# Patient Record
Sex: Male | Born: 1940
Health system: Southern US, Community
[De-identification: ages and names within clinical notes are randomized; demographics above are authoritative.]

## PROBLEM LIST (undated history)

## (undated) ENCOUNTER — Emergency Department: Discharge status: Absent without leave | Payer: Self-pay

## (undated) DIAGNOSIS — I82409 Acute embolism and thrombosis of unspecified deep veins of unspecified lower extremity: Secondary | ICD-10-CM

## (undated) DIAGNOSIS — F0390 Unspecified dementia without behavioral disturbance: Secondary | ICD-10-CM

## (undated) DIAGNOSIS — C61 Malignant neoplasm of prostate: Secondary | ICD-10-CM

## (undated) DIAGNOSIS — I2510 Atherosclerotic heart disease of native coronary artery without angina pectoris: Secondary | ICD-10-CM

## (undated) DIAGNOSIS — E039 Hypothyroidism, unspecified: Secondary | ICD-10-CM

## (undated) DIAGNOSIS — M47816 Spondylosis without myelopathy or radiculopathy, lumbar region: Secondary | ICD-10-CM

## (undated) DIAGNOSIS — E785 Hyperlipidemia, unspecified: Secondary | ICD-10-CM

## (undated) DIAGNOSIS — I10 Essential (primary) hypertension: Secondary | ICD-10-CM

## (undated) HISTORY — PX: BACK SURGERY: SHX140

---

## 2004-08-16 ENCOUNTER — Ambulatory Visit: Payer: Self-pay | Admitting: Cardiology

## 2004-08-17 ENCOUNTER — Ambulatory Visit (HOSPITAL_COMMUNITY): Admission: RE | Admit: 2004-08-17 | Discharge: 2004-08-19 | Payer: Self-pay | Admitting: *Deleted

## 2004-08-17 ENCOUNTER — Ambulatory Visit: Payer: Self-pay | Admitting: Cardiology

## 2004-08-23 ENCOUNTER — Ambulatory Visit (HOSPITAL_COMMUNITY): Admission: RE | Admit: 2004-08-23 | Discharge: 2004-08-24 | Payer: Self-pay | Admitting: Cardiology

## 2004-09-07 ENCOUNTER — Ambulatory Visit: Payer: Self-pay | Admitting: Cardiology

## 2006-06-28 ENCOUNTER — Ambulatory Visit: Payer: Self-pay | Admitting: Physician Assistant

## 2006-10-16 ENCOUNTER — Ambulatory Visit: Payer: Self-pay | Admitting: Cardiology

## 2006-11-01 ENCOUNTER — Inpatient Hospital Stay (HOSPITAL_COMMUNITY): Admission: RE | Admit: 2006-11-01 | Discharge: 2006-11-04 | Payer: Self-pay | Admitting: Neurosurgery

## 2009-07-21 ENCOUNTER — Encounter: Admission: RE | Admit: 2009-07-21 | Discharge: 2009-07-21 | Payer: Self-pay | Admitting: Neurosurgery

## 2010-07-04 NOTE — Assessment & Plan Note (Signed)
[facility]                          [facility] OFFICE NOTE   NAME:Tony Chapman                 MRN:          [id number]  DATE:10/16/2006                            DOB:          05-09-1940    REASON FOR REFERRAL:  Preoperative clearance.   HISTORY OF PRESENT ILLNESS:  [name] is a 70 year old male patient  followed by Dr. Andee Lineman with a history of coronary artery disease, status  post two vessel PCI in June, 2006, who presents to the office today for  preoperative clearance.  He apparently has some sort of cyst on his  spine that is resulting in right-sided radiculopathy.  He has been  evaluated by Dr. Channing Mutters, and he is to undergo surgery.  This is apparently  going to be done at [facility] in the coming weeks.  The patient  denies any recurrent episodes of chest pain or shortness of breath.  He  denies any exertional chest heaviness or tightness.  He denies any  syncope or near syncope.  He denies any orthopnea, PND, or pedal edema.  He denies any tachy palpitations.  He denies any exertional shortness of  breath.   CURRENT MEDICATIONS:  1. Lipitor 40 mg a day.  2. Levothyroxine 125 mcg a day.  3. Generic Lotrel, unknown dosage, daily.  4. Aspirin 325 mg daily.  5. Hydrocodone p.r.n.  6. Plavix 75 mg daily.  He stopped taking this about a week or two ago      in preparation for his surgery.  7. Nitroglycerin p.r.n. chest pain.   ALLERGIES:  No known drug allergies.   SOCIAL HISTORY:  Denies any tobacco abuse.   REVIEW OF SYSTEMS:  Please see HPI.  He denies any fever, chills, cough,  melena, hematochezia, or hematuria.  The rest of the review of systems  are negative.   PHYSICAL EXAMINATION:  He is a well-developed and well-nourished male in  no distress.  Blood pressure 151/87, pulse 63, weight 196 pounds.  HEENT:  Normal.  NECK:  Without JVD.  CARDIAC:  S1 and S2.  Regular rate and rhythm.  LUNGS:  Clear to  auscultation bilaterally with no wheezes, rales or  rhonchi.  ABDOMEN:  Soft and nontender with normoactive bowel sounds.  No  organomegaly.  EXTREMITIES:  Without edema.  Calves are nontender.  SKIN:  Warm and dry.  NEUROLOGIC:  He is alert and oriented x3.  Cranial nerves II-XII are  grossly intact.   IMPRESSION:  1. Coronary artery disease.      a.     Status post bare metal stenting to the obtuse marginal and       left circumflex in June, 2006.      b.     Status post bare metal stenting to the right coronary artery       in June, 2006.      c.     Initial percutaneous intervention complicated by right groin       hematoma and retroperitoneal bleed.      d.     Residual coronary artery disease at the time of  catheterization:  Severely calcified left main, calcified left       anterior descending artery throughout the proximal portion, 50%       distal left anterior descending artery stenosis, diagonal mid 75%       before the bifurcation, and 50% stenosis in the ostium.  2. Preserved left ventricular function.  3. Hyperlipidemia.  4. Hypertension.  5. Hypothyroidism.  6. Spinal cyst, needs surgery.   PLAN:  Patient presents to the office today for preoperative clearance.  He underwent revascularization in 2006.  He has had no recurrent  symptoms of ischemia.  According to [facility] and AHA guidelines, he requires  no further cardiac workup prior to his noncardiac surgery and should be  an acceptable risk.  It is safe for him to come off the Plavix for his  surgery, as he underwent bare metal stenting two years ago.  However, he  should remain on aspirin throughout the perioperative period, unless  this is contraindicated from a surgical standpoint.  His blood pressure  does need better control.  Since there is upcoming surgery planned, I  have elected to place him on Toprol XL 25 mg a day.  Our service will  certainly be available in the perioperative period as  necessary.   I have discussed the above assessment and plan today with Dr. Andee Lineman.      Tereso Newcomer, PA-C  Electronically Signed      Learta Codding, MD,FACC  Electronically Signed   SW/MedQ  DD: 10/16/2006  DT: 10/16/2006  Job #: [id number]   cc:   Payton Doughty, M.D.  Dhruv Sherril Croon

## 2010-07-04 NOTE — Op Note (Signed)
NAMECOTY, LARSH NO.:  [id number]   MEDICAL RECORD NO.:  [id number]          PATIENT TYPE:  INP   LOCATION:  2899                         FACILITY:  [facility]   PHYSICIAN:  Payton Doughty, M.D.      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   PROCEDURE:  Right L5 laminotomy, foraminotomy for resection of synovial  cyst.   SURGEON:  Payton Doughty, M.D.   ANESTHESIA:  General endotracheal.   PREPARATION:  Sterile Betadine prep and scrub with alcohol wipe.   COMPLICATIONS:  None.   NURSE ASSISTANT:  [place].   DOCTOR ASSISTANT:  Hilda Lias, M.D.   BODY OF TEXT:  This is a 70 year old gentleman with a right S1  radiculopathy related to ligamentum flavum and synovial cyst on the  right side, taken to the operating room and smoothly anesthetized and  intubated, placed prone on the operating table following shave, prep and  drape in the usual sterile fashion.  The skin was infiltrated with 1%  lidocaine and 1:400,000 epinephrine.  The skin was incised over L5 and  S1.  The laminae of L5 and S1 were dissected free in the subperiosteal  plane.  Intraoperative x-ray showed a marker under L4.  We operated at  the next level below that.  Using the drill a hemisemilaminectomy was  carried out in L5 and also over the top of S1 on the right side.  The  ligamentum flavum was identified and removed.  It was extremely heavy  and redundant and contained within it was a large cyst that extended  from the L5-S1 facet joint and was a synovial cyst.  The ligamentum  flavum and the cyst were peeled off the dura on the right side, allowing  decompression of the right-sided thecal sac.  Once it was removed, the  sac was explored and was found to be free.  The wound was irrigated and  hemostasis assured.  Depo-Medrol-soaked fat was placed in the laminotomy  defect.  Successive layers of 0 Vicryl, 2-0 Vicryl, 3-0 nylon were used  to close.  A Betadine Telfa dressing was applied, made occlusive with  OpSite, and the patient returned to the recovery room in good condition.    .           ______________________________  Payton Doughty, M.D.     MWR/MEDQ  D:  11/01/2006  T:  11/02/2006  Job:  [id number]

## 2010-07-04 NOTE — Assessment & Plan Note (Signed)
[facility]                          [facility]ICE NOTE   NAME:Tony Chapman                 MRN:          [id number]  DATE:06/28/2006                            DOB:          1940/09/10    CARDIOLOGIST:  Dr. Lewayne Chapman.   PRIMARY CARE PHYSICIAN:  Dr. Doreen Chapman.   HISTORY OF PRESENT ILLNESS:  Tony Chapman is a 70 year old male patient  followed by Dr. Andee Chapman with a history of coronary artery disease, status  post 2-vessel PCI in June of 2006.  He first had his obtuse marginal  stented with a bare metal stent.  The procedure was complicated by right  groin hematoma and retroperitoneal bleed.  He was eventually readmitted  back to [facility] in July of 2006 for PCI of his RCA lesion.  This was also stented with a bare metal stent.  At the time of his  initial evaluation, his residual CAD included severely calcified left  main, calcified LAD throughout the proximal portion, 50% distal LAD  stenosis, diagonal with a mid 75% stenosis before the bifurcation, and  50% stenosis in the ostium.  He has good LV function, and he has a 55%  by catheterization.  He returns today for routine followup.  He is doing  well.  Denies chest pain, shortness of breath.  Denies syncope.  Denies  palpitations.  Denies orthopnea or paroxysmal nocturnal dyspnea, lower  extremity edema.  He is active.  He lifts weights several times a week  without any chest discomfort or shortness of breath.   MEDICATIONS:  1. Lipitor 40 mg daily.  2. Levothyroxine 125 mcg a day.  3. Amlodipine/benazepril unknown dosage daily.  4. Terbinafine 250 mg a day.  5. Cyclobenzaprine 10 mg a day.  6. Diclofenac sodium 75 mg a day.  7. Aspirin 325 mg a day.  8. Nitroglycerin p.r.n. chest pain.   ALLERGIES:  NO KNOWN DRUG ALLERGIES.   SOCIAL HISTORY:  Denies any tobacco abuse.   PHYSICAL EXAMINATION:  He is a well-nourished, well-developed male in no  distress.  Blood  pressure 148/83, pulse 66, weight 207.6 pounds.  HEENT:  Normal.  NECK:  Without JVD.  Carotids without bruits bilaterally.  CARDIAC:  Normal S1, S2.  Regular rate and rhythm without murmurs.  LUNGS:  Clear to auscultation bilaterally without wheezing, rhonchi, or  rales.  ABDOMEN:  Soft, nontender, with normoactive bowel sounds, no  organomegaly, no bruits.  EXTREMITIES:  Without edema, calves soft, nontender.  SKIN:  Warm and dry.  Femoral arteries are 2+ bilaterally without bruits.   Electrocardiogram reveals sinus rhythm with a heart rate of 76, left  axis deviation, no acute changes.   IMPRESSION:  1. Coronary artery disease.      a.     Status post bare metal stenting to the obtuse marginal of       the left circumflex, as well as bare metal stenting of the right       coronary artery in summer of 2006.      b.     Initial percutaneous coronary intervention complicated by  right groin hematoma and retroperitoneal bleed.      c.     Residual coronary artery disease as outlined above.  2. Good left ventricular function.  3. Hyperlipidemia.  4. Hypertension.  5. Hypothyroidism.   PLAN:  The patient presents to the office today for routine followup.  He is doing well without any symptoms of chest pain or shortness of  breath.  He came off of Plavix about a month or 2 ago.  He would really  like to stay on this medication.  I had a long discussion with him today  about the reasons why he only needed to stay on Plavix 30 days post  intervention.  However, he is still quite interested in remaining on  this as he has done so well since we have seen him.  Therefore, I think  it is reasonable to keep him on the Plavix.  I have given him a  prescription for Plavix 75 mg a day.  He can remain on a baby aspirin a  day.  He is to followup with Dr. Sherril Chapman for his blood pressure and  cholesterol.  His LDL goal is less than 70 and blood pressure goal is  less than 140/90.  He can  follow up in the office in 12 months' time.      Tereso Newcomer, PA-C       Learta Codding, MD,FACC    SW/MedQ  DD: 06/28/2006  DT: 06/28/2006  Job #: [id number]   cc:   Tony Chapman

## 2010-07-04 NOTE — H&P (Signed)
NAMEALOIS, COLGAN NO.:  [id number]   MEDICAL RECORD NO.:  [id number]          PATIENT TYPE:  INP   LOCATION:  3311                         FACILITY:  [facility]   PHYSICIAN:  Payton Doughty, M.D.      DATE OF BIRTH:  07-Feb-1941   DATE OF ADMISSION:  11/01/2006  DATE OF DISCHARGE:                              HISTORY & PHYSICAL   ADMITTING DIAGNOSIS:  Right L5-S1 synovial cyst.   SERVICE:  Neurosurgery.   This is a very nice 70 year old right-handed black gentleman sometimes  having increasing pain in his back down his right leg.  Had numbness on  the right foot.  MR showed synovial cyst on the right side.  He is  admitted now for removal of the cyst.   MEDICAL HISTORY:  Remarkable coronary artery disease, vascular disease.  He had stents in his heart in 2006, has a penile implant in 2003 and  2008.   MEDICATIONS:  1. Amlodipine once a day.  2. Plavix once a day.  3. Lipitor once a day.  4. Oxycodone four a day.  5. Hydrocodone on a p.r.n. basis.  6. An aspirin a day.   He has no allergies.   SOCIAL HISTORY:  Does not smoke, does not drink.  Works for the [employer] up in [place].   FAMILY HISTORY:  Remarkable for hypertension.   REVIEW OF SYSTEMS:  Remarkable for hypertension, hypercholesterolemia,  leg pain, arthritis, thyroid disease.   HEENT:  Exam within normal limits.  He has good range of motion of his  neck:  CHEST:  Clear.  CARDIAC:  Regular rate and rhythm.  ABDOMEN:  Nontender with no hepatosplenomegaly.  EXTREMITIES:  Without clubbing or cyanosis.  GENITOURINARY EXAM:  Deferred.  PERIPHERAL PULSES:  Good.  NEUROLOGICAL:  He is awake, alert and oriented.  His cranial nerves are  intact.  Motor exam shows 5/5 strength throughout the upper extremities  and left lower extremity.  Right lower extremity has a little bit of  dorsiflexion weakness.  Reflexes are one at the knees, absent at the  right ankle, one at the left ankle.   Straight leg raise is negative.   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   The plan is for resection.  He has been seen by Dr. Andee Lineman and cleared.  The risks and benefits have been discussed with him and he wishes to  proceed.           ______________________________  Payton Doughty, M.D.     MWR/MEDQ  D:  11/01/2006  T:  11/02/2006  Job:  [id number]

## 2010-07-04 NOTE — Discharge Summary (Signed)
NAME:  Tony Chapman, Tony Chapman NO.:  [id number]   MEDICAL RECORD NO.:  [id number]           PATIENT TYPE:   LOCATION:                                 FACILITY:   PHYSICIAN:  Payton Doughty, M.D.           DATE OF BIRTH:   DATE OF ADMISSION:  11/01/2006  DATE OF DISCHARGE:  11/04/2006                               DISCHARGE SUMMARY   ADMITTING DIAGNOSIS:  Synovial cyst on the right side at L5-S1.   DISCHARGE DIAGNOSIS:  Synovial cyst on the right side at L5-S1.   OPERATIVE PROCEDURE:  Right L5-S1 laminectomy for synovial cyst.   SURGEON:  Trey Sailors, M.D.   SERVICE:  Neurosurgery.   COMPLICATIONS:  None.   DISPOSITION:  Discharge home.   HISTORY:  A 70 year old right-handed black gentleman whose history and  physical is recounted in the chart.  He has a right S1 radiculopathy  related to synovial cyst.  He had coronary artery disease and is cleared  by cardiology.  Exam was intact.  Safe for right S1 radiculopathy.  He  was admitted after ascertaining normal laboratory values and underwent a  5-1 laminectomy, removal of synovial cyst.  Postoperatively, his leg  pain is gone.  His strength is full.  He spent 3 nights in the ACU,  being monitored.  His cardiac status is completely stable.  He is on all  his home medications.  He is being discharged home in the care of his  family.   FOLLOWUP:  His followup will be in the [facility] offices in about 10 days  for suture removal.           ______________________________  Payton Doughty, M.D.     MWR/MEDQ  D:  11/04/2006  T:  11/04/2006  Job:  [id number]

## 2010-07-07 NOTE — Cardiovascular Report (Signed)
NAME:  Tony Chapman, Tony Chapman NO.:  [id number]   MEDICAL RECORD NO.:  [id number]          PATIENT TYPE:  OIB   LOCATION:                               FACILITY:  [facility]   PHYSICIAN:  Arturo Morton. Riley Kill, M.D. [facility] OF BIRTH:  Oct 02, 1940   DATE OF PROCEDURE:  08/23/2004  DATE OF DISCHARGE:                              CARDIAC CATHETERIZATION   INDICATIONS:  Patient is a very pleasant gentleman who previously underwent  stenting of the circumflex coronary artery.  This is a heavily calcified and  tortuous circumflex with a small caliber lumen at the site; and, therefore,  a non-drug-eluting stent was placed. He had a fairly heavily calcified right  coronary artery; and he was brought back, today, for a repeat  catheterization and possible intervention.  Postprocedure 6 days ago the  patient had a retroperitoneal bleed associated with the initial groin hold  postprocedure.  He has recovered nicely and doing well. Chest x-ray, today,  shows no acute abnormality and hemoglobin is up. We discussed the risks with  the patient and he was agreeable to proceed.   PROCEDURE:  1.  Selective coronary arteriography of the left coronary artery.  2.  Intravascular ultrasound of the right coronary artery.  3.  Percutaneous stenting of the right coronary artery.   DESCRIPTION OF PROCEDURE:  The patient was brought to the catheterization  lab and prepped and draped in usual fashion following informed consent.  Through an anterior puncture the left femoral artery was easily entered and  a 6-French sheath was placed. Selective coronary arteriography of the left  coronary artery was performed. Following this bivalve __________  was given  according to protocol. ACT was checked and was found to be 330.   Following this preparations were made for intravascular ultrasound.  A  [employer] RCA guide with side holes was then utilized to engage the  right coronary.  The lesion was crossed  with a Luge wire. Intravascular  ultrasound was performed which demonstrated a fair amount of calcium  particularly at the lesion site. The lesion did appear to be dilatable, but  it was felt that there might be some limitation of the ability of the vessel  to dilate much beyond 3 mm in an otherwise 3.5 mm artery. With this in mind,  we made the strategic decision to place a non-drug-eluting platform as a non-  drug-eluting platform had already been placed because of vessel size in the  circumflex. In addition, there was evidence by intravascular ultrasound of a  more proximal lesion about 20 mm proximal to the site of the high-grade  stenosis; and it that was felt that we would be best, to cover this entire  area with a 28-mm stent.   The lesion was predilated initially using a 2.5 by 10 cutting balloon. There  was fairly marked improvement in the artery.  We then upgraded to a 27.5 and  3.0 balloons was then subsequently a 27.5 Liberte stent 28-mm in length was  deployed into the artery. This was taken up nicely to approximately 16  atmospheres.  There was mild tapering in the area where the extensive  calcification was noted, but there was still a good lumen from this.  Following this we used a double wire to get down a post dilatation balloon;  and post dilatation was done with a 3.5-mm balloon. This was taken up to  about 12 atmospheres. Intravascular ultrasound was then performed.  The  major findings of the intravascular ultrasound was that the distal edge of  the stent was well deployed. It was deployed in a tapered area before the  very large area distal to this location. The overall deployment of the stent  was excellent except for a small area with 1-2 struts which were not fully  deployed, proximally in the midportion of the stent. The proximal stent was  well deployed angiographically, there is slight haziness proximal to the  stent site; and we carefully evaluated this using  ultrasound which suggested  a large amount of calcium.  There was a small irregular area just above the  stent by intravascular ultrasound, but it was also present prior to the  procedure. It was felt not to represent a luminal flap. After removal of the  intravascular ultrasound, we put the 3.5 balloon back down into the area  where there were 1-2 struts out; and we took the 3.5-mm balloon up to 14-16  atmospheres x2 to make sure the stent was fully deployed at this location.  This was subsequently removed. We then removed both wires.  Final  angiographic views were obtained.  He tolerated the procedure well without  complication. All catheters were removed and the femoral sheath sewn into  place; and he was taken to the holding area in satisfactory clinical  condition.   HEMODYNAMIC DATA:  1.  Central aortic pressure 120/63, mean 87.  2.  Angiographic data on plain fluoroscopy was extensive calcification of      both the LAD and the circumflex.  The LAD appears unchanged from the      original study.  It is extensively calcified. There is segmental      plaquing in the midportion of the vessel; and then also a lesion      distally of about 70%.  The midportion is just diffusely irregular with      34% segmental plaquing. There is a tapered diagonal branch which has      about 70% narrowing in the midportion and it also is calcified,      especially at the lesion.  3.  The circumflex is circuitous and provides a large marginal branch. This      marginal branch previously had a 95% stenosis, but the stent looks well      deployed and fully in place.  One additional note is the presence of a      70-80% apical stenosis in the apical portion of the LAD which is noted      on the previous study.  4.  The right coronary artery, also, is a heavily calcified vessel. It      tapers at the junction of the proximal-and-mid vessel to about 30 40%     and there is slight hypodensity.  Following  this, there is about 40-50%      narrowing then an 80% area of narrowing that extends over about 10 mm.      The PDA is diffusely irregular as was the posterolateral branch. The      distal vessel was heavily calcified and markedly  ectatic but without      significant focal narrowing. Following the balloon dilatation and      stenting the 80% area of narrowing and then proximal to that is reduced      to less than 20% with really a nice angiographic result. There is 40%      narrowing just proximal to the stent and on ultrasound, a lot of this is      demonstrated with calcification. Runoff is TIMI 3 at the completion of      procedure.   Intravascular ultrasound was done distal to the lesion in then coming  proximally. Prior to the intervention, the diameter of the lesion site was  about 17 x 16. Following dilatation this was 30 x 31 with a fully expanded  stent. Above the stent is about 32 x 34 lumen.  There is calcification  proximal to the stent site on the followup intravascular ultrasound and this  is fairly extensive with a fair amount of plaque.   CONCLUSION:  Successful percutaneous stenting of the heavily calcified right  coronary artery done without complication.   DISPOSITION:  The patient will need to be followed closely. He is at risk  for target vessel revascularization due to use of non-drug-eluting stents.  These were used for specific reasons. The circumflex was stented with a  small-  caliber stent that is not available in the drug-eluting variety.  The right  coronary stent, we utilized a non-drug-eluting stent because of the  extensive calcification of the vessel which would prevent complete full  expansion of the stent. The patient had a nice angiographic result; however,  and my hope is that he will do well in the long run.       TDS/MEDQ  D:  08/23/2004  T:  08/23/2004  Job:  [id number]   cc:   Vida Roller, M.D.  Fax: [phone]   Doreen Beam  [address]  [place]  [place] [address]  Fax: [phone]   Learta Codding, M.D. [facility]  [address]  [address]  [place]  [place] [address]

## 2010-07-07 NOTE — Cardiovascular Report (Signed)
NAME:  Tony Chapman, Tony Chapman NO.:  [id number]   MEDICAL RECORD NO.:  [id number]          PATIENT TYPE:  OIB   LOCATION:  2899                         FACILITY:  [facility]   PHYSICIAN:  Vida Roller, M.D.   DATE OF BIRTH:  1940/12/02   DATE OF PROCEDURE:  08/17/2004  DATE OF DISCHARGE:                              CARDIAC CATHETERIZATION   PRIMARY CARE PHYSICIAN:  Dr. Sherril Croon   CARDIOLOGIST:  Dr. Lewayne Bunting   HISTORY OF PRESENT ILLNESS:  Tony Chapman is a 70 year old man who presented to  [facility] with unstable angina.  Was evaluated by Dr. Andee Lineman there.  Had a story that was very concerning for severe coronary disease and was  referred for heart catheterization.   PROCEDURES PERFORMED:  1.  Left heart catheterization.  2.  Selective coronary angiography.  3.  Left ventriculography.   DETAILS OF THE PROCEDURE:  After obtaining informed consent the patient was  brought to the cardiac catheterization laboratory in the fasting state.  There he was prepped and draped in the usual sterile manner and local  anesthetic was obtained over the right groin using 1% lidocaine without  epinephrine.  The right femoral artery was cannulated using the modified  Seldinger technique with a 6-French 10 cm sheath and left heart  catheterization was performed using a 6-French Judkins left #4, a 6-French  Judkins right #4, and a 6-French pigtail catheter.  A 6-French no-torquing  catheter was also used to cannulate the right coronary artery.  The pigtail  catheter was used for left ventriculography in the 30 degree RAO view.  At  the conclusion of the procedure the catheters were removed.  The sheath was  sewn into place and the patient proceed to percutaneous revascularization.  Total fluoroscopic time 7.4 minutes.  Total ionized contrast 100 mL.   RESULTS:  Aortic pressure 144/73, mean of 99 of mmHg.   LV pressure 142/3 with an end-diastolic pressure of 16 mmHg.   SELECTIVE  CORONARY ANGIOGRAPHY:  The right coronary artery is a large,  dominant vessel.  Has 25% stent stenosis in its proximal portion.  There is  a 70% stenosis at the origin of an acute marginal branch and a 50% stenosis  in the middle of a small posterior descending coronary artery.  The  posterior descending coronary artery appears to be diffusely diseased as  does the posterolateral branch.   The left main coronary artery is a moderate caliber vessel which is severely  calcified.  It branches into two large arteries, the left anterior  descending coronary artery which is severely calcified throughout its  proximal portion.  It does not have any significant obstructive disease, but  appears to be severely diseased until it gets near the apex when there is a  50% distal stenosis.  There is a large branching diagonal branch which has  severe disease in its mid portion with a 75% stenosis before the  bifurcation.  There is a 50% stenosis in its ostium.   The circumflex coronary artery is a moderate caliber severely diseased  vessel with calcium throughout  its proximal portion.  It tapers rapidly into  a branching obtuse marginal which has a 95% mid stenosis and appears to be  the culprit vessel.   Left ventriculogram reveals preserved LV systolic function with an ejection  fraction of 55%.  No wall motion abnormalities and no mitral regurgitation.   ASSESSMENT:  1.  Severe two vessel coronary disease in the circumflex and right coronary      arteries with branch vessel disease in the left anterior descending.  2.  Normal left ventricular systolic function.   PLAN:  Refer this patient for intervention of the circumflex and potentially  staged intervention of the right coronary artery.       JH/MEDQ  D:  08/17/2004  T:  08/17/2004  Job:  [id number]

## 2010-07-07 NOTE — Discharge Summary (Signed)
NAME:  Tony Chapman, GRISSINGER NO.:  [id number]   MEDICAL RECORD NO.:  [id number]          PATIENT TYPE:  OIB   LOCATION:  6532                         FACILITY:  [facility]   PHYSICIAN:  Arturo Morton. Riley Kill, M.D. [facility] OF BIRTH:  19-Nov-1940   DATE OF ADMISSION:  08/23/2004  DATE OF DISCHARGE:  08/24/2004                                 DISCHARGE SUMMARY   PRINCIPAL DIAGNOSIS:  Coronary artery disease.   SECONDARY DIAGNOSIS:  1.  Normocytic anemia.  2.  Hyperlipidemia.  3.  Hypertension.  4.  History of right groin retroperitoneal hematoma.  5.  Hypothyroidism.   ALLERGIES:  No known drug allergies.   PROCEDURE:  PCI and stenting of the right coronary artery with a bare metal  stent.   HISTORY OF PRESENT ILLNESS:  This is a 70 year old African/American male who  was recently admitted to [facility] on August 15, 2004, with chest  tightness and pressure and subsequently was transferred to [facility] on August 17, 2004, where he underwent a coronary evaluation  and cardiac catheterization, revealing significant RCA and obtuse marginal  disease.  During the admission his obtuse marginal was successfully stented  and his post-procedure course was complicated by a right groin hematoma with  a retroperitoneal bleed.  He was eventually discharged home on August 19, 2004,  and was set up to come back for treatment of the RCA lesion on August 23, 2004.   HOSPITAL COURSE:  The patient presented to the Short-Stay Unit on August 23, 2004.  He was taken to the cardiac catheterization laboratory where he  underwent a successful PCI and stenting of the right coronary artery with a  2.75 mm x 28.0 mm Liberte stent placed.  He tolerated this procedure well  without any complications.  This morning he has been ambulating in the  hallway without any recurrent chest discomfort or limitations.  He did have  some ecchymosis noted at the left groin site; however, no bruit was  noted.  An ultrasound was performed, which was negative for evidence of pseudo-  aneurysm or AV fistula.   DISPOSITION:  The patient is being discharged home today in satisfactory  condition.   DISCHARGE LABORATORY DATA:  Hemoglobin 10.2, hematocrit 29.2, WBC 8.8,  platelets 318.  Sodium 136, potassium 3.8, chloride 104, CO2 of 26, BUN 16,  creatinine 1.4, glucose 103.  Total bilirubin 0.8, alkaline phosphatase 45,  AST 26, ALT 18.  Cardiac markers were negative x1.  Total cholesterol 151,  triglycerides 117, HDL 30, LDL 98, calcium 9.2.  Hemoglobin A1c of 5.7.  TSH  6.501.   CONDITION ON DISCHARGE:  The patient is being discharged home in a good  condition.   FOLLOWUP:  He has an appointment with Dr. Learta Codding in [place] on September 07, 2004, at 2:45 p.m.   DISCHARGE MEDICATIONS:  1.  Aspirin 81 mg daily.  2.  Plavix 75 mg daily.  3.  Lotrel 10/20 mg daily.  4.  Synthroid 125 mcg daily.  5.  Lipitor 40 mg q.h.s.  6.  Nitroglycerin 0.4 mg sublingual p.r.n. chest pain.   DISCHARGE INSTRUCTIONS:  The patient should have repeat lipids and liver  function tests as well as TFT's in four to six weeks.  We have adjusted his  dose of Synthroid on this admission, given the elevated TSH.   DURATION OF DISCHARGE COURSE:  Was 40 minutes.       CRB/MEDQ  D:  08/24/2004  T:  08/24/2004  Job:  [id number]

## 2010-07-07 NOTE — Cardiovascular Report (Signed)
NAMEFOSTER, FRERICKS NO.:  [id number]   MEDICAL RECORD NO.:  [id number]          PATIENT TYPE:  OIB   LOCATION:  2899                         FACILITY:  [facility]   PHYSICIAN:  Arturo Morton. Riley Kill, M.D. [facility] OF BIRTH:  [birth year]   DATE OF PROCEDURE:  08/17/2004  DATE OF DISCHARGE:                              CARDIAC CATHETERIZATION   INDICATIONS:  The patient is a 70 year old gentleman who presents with  shoulder pain.  Cardiac catheterization was recommended and he underwent  catheterization demonstrating a subtotal occlusion of the circumflex  coronary artery as well as a moderately high-grade right coronary.  He also  has diagonal disease and distal LAD disease. Because the LAD did not involve  proximal disease, it was felt that an approach of percutaneous intervention  combined with medical therapy made to most sense.   Diagnostic catheterization was done by Dr. Dorethea Clan. We discussed the case  with the patient and subsequently spoke with his wife, by phone, who was  getting ready to get en route. We took a picture of the femoral artery  because the stick appeared to be slightly high, but by angiography appeared  to be in excellent position. We initially started off with a 35 Volta  guiding catheter, and a Luge wire. This was manipulated down the circumflex.  The lesion, itself, did not dilate very easily.  It remained very stiffened  despite 12 and 13 atmospheres inflations did not want to opened. Ultimately,  we exchanged the 2-mm Maverick balloon for a 2-mm Quantum Maverick balloon  and this was taken up to higher pressures, up to nearly 18 atmospheres. This  appeared to successfully open the lesion after a high-pressure inflation.  Following this, we deployed a 2-mm mini Vision stent that was 18-mm in  length. This was taken up to 16-17 atmospheres and successfully deployed at  this pressure. In trying to remove the balloon, the wire came out there  appeared to be in excellent angiographic appearance to the stented area;  and, therefore, since we delivered the stent at fairly high pressure we  elected not to post dilate and try to get back down the vessel. Overall it  was tolerated well. He was taken to the holding area in satisfactory  clinical condition.   ANGIOGRAPHIC DATA:  The circumflex coronary artery is highly circuitous and  provides a heavily calcified vessel which consists of largely a first  marginal branch. There is 95% stenosis. Following the deployment of the  stent, this was reduced to 0% residual luminal narrowing with a good  antegrade angiographic result. Runoff into the distal vessel was TIMI 3.  There was a small subbranch that had approximately 80-90% proximal narrowing  that laid over the origin of the stent, but this was basically unchanged.   CONCLUSION:  Successful stenting of the circumflex marginal.   DISPOSITION:  We will schedule the patient's right coronary artery likely  for Monday. He will remain on aspirin and Plavix indefinitely.       TDS/MEDQ  D:  08/17/2004  T:  08/17/2004  Job:  [id number]  cc:   Doreen Beam  [address]  [place]  [place] [address]  Fax: [phone]   Vida Roller, M.D.  Fax: [phone]   CV Laboratory   Learta Codding, M.D. [facility]  [address]  [address]  [place]  [place] [address]

## 2010-07-07 NOTE — Discharge Summary (Signed)
NAME:  Tony Chapman, Tony Chapman NO.:  [id number]   MEDICAL RECORD NO.:  [id number]          PATIENT TYPE:  OIB   LOCATION:  6527                         FACILITY:  [facility]   PHYSICIAN:  Vida Roller, M.D.   DATE OF BIRTH:  [birth year]   DATE OF ADMISSION:  08/17/2004  DATE OF DISCHARGE:  08/19/2004                                 DISCHARGE SUMMARY   HISTORY OF PRESENT ILLNESS:  Tony Chapman is a 70 year old African-American  male who was admitted to [facility] on August 15, 2004.  Dr. Andee Lineman  saw him in consultation on August 16, 2004, for the symptoms of sudden chest  tightness/pressure in the anterior upper chest without radiation.  The  patient states that the symptoms usually happen with exertion, but can  happen at any time and had been present for one month.  Associated with  shortness of breath and lasting approximately five minutes.  At [facility] he ruled out for myocardial infarction.  With his risk factors of  hyperlipidemia, hypertension, family history, it was felt that he should  undergo cardiac catheterization.  Thus, was transferred.   LABORATORY DATA:  At [facility], x-ray did not show any acute  processes.  CK-MB's and troponin's were negative x3.  Sodium was 136,  potassium 3.5, BUN 18, creatinine 1.1, glucose 96, normal LFT's.  H&H 14.5  and 42.3, normal indices, platelets 332, WBC 6.6, PTT 26.3, PT 11.7.  EKG  showed sinus bradycardia, left axis deviation, delayed R-waves, nonspecific  ST-T wave changes, left anterior fascicular block.  At [facility],  abdominal ultrasound showed a right groin hematoma extending cephalad deep  into the right rectus muscle.  CT scan of the pelvis and the abdomen showed  a large right retroperitoneal hematoma, cholelithiasis, left adrenal  adenoma.  At [facility], admission H&H was 13.2 and 38.9, normal indices,  platelets 311, WBC 12.2.  Subsequent H&H showed a gradual decline of H&H at  the time  of discharge on August 19, 2004, it was 10.9 and 31.7, normal indices,  platelets 271, WBC 9.7.  Post-catheterization, sodium was 135, potassium  3.8, BUN 16, creatinine 1.5, glucose 119, normal LFT's.  Post-  catheterization CK total, MB, and troponin were negative.  Fasting lipids  showed a total cholesterol of 151, triglycerides of 117, HDL of 30, LDL of  98.   HOSPITAL COURSE:  [name] was transferred to [facility] to  undergo cardiac catheterization.  This was performed on August 17, 2004, by  Dr. Dorethea Clan.  His ejection fraction was 55%.  Dr. Dorethea Clan felt that he had  severe two vessel coronary artery disease with branched vessel disease in  the diagonal #2.  Dr. Riley Kill performed Maverick stenting to the OM branch  of the circumflex reducing this from 95% to 0%.  Dr. Riley Kill felt that he  should return to undergo intervention to disease present in his right  coronary artery.  However, post-catheterization, the patient developed pain  in his abdomen during sheath hold.  Dr. Riley Kill evaluated and noted a  hematoma of moderate size.  Additional pressure was held.  Subsequent CBC  showed a gradual decline.  Abdominal ultrasound was performed as previously  described.  Cardiac rehab assisted with education.  They did not ambulate  the patient secondary to the hematoma.  Dr. Riley Kill, after review of the CT  scan and abdominal ultrasound, felt that he should be observed for another  24 hours with gradual increase in activity.  By August 19, 2004, the patient  remained tender in the right groin area with abdominal tenderness, H&H was  stable, and had not substantially declined further.  Dr. Eden Emms felt that  the patient could be discharged home.   DISCHARGE DIAGNOSES:  1.  Unstable angina, status post catheterization as previously described,      please refer to dictation, status post Maverick stenting to the obtuse      marginal.  Residual coronary artery disease.  2.  Right groin and  retroperitoneal hematoma, a complication from cardiac      catheterization.  3.  Normocytic anemia secondary to hematoma.  4.  Hyperglycemia without history of diabetes.  5.  Hyperlipidemia.  6.  History as previously dispositioned.   DISCHARGE MEDICATIONS:  1.  Aspirin 325 mg daily.  2.  Plavix 75 mg daily.  3.  Lotrel 10/20 mg daily.  4.  Synthroid 112 mcg daily.  5.  His Lipitor was increased to 40 mg p.o. q.h.s.  6.  He received a prescription for sublingual nitroglycerin 0.4 mg p.r.n.  7.  Dr. Eden Emms has also given him a Percocet prescription for 30 pills with      one refill for pain.   He received discharge instruction sheets in regards to catheterization site,  as well as information about hyperlipidemia.   FOLLOWUP:  He will report to [facility] on August 23, 2004, at 8:30, to undergo  intervention at approximately 10:30, on his RCA.  He was advised nothing to  eat or drink after midnight on Tuesday, but may take medications with a  small sip of water.  Orders had been left at [facility] for planned  intervention.  We will check a hemoglobin A1C given his border  hyperglycemia.  We will also check a TSH given his history of  hypothyroidism.  At the time of intervention on August 23, 2004, he will need a  follow up appointment with Dr. Andee Lineman in [place] after his second intervention.  In approximately six to eight weeks, he will need repeat fasting lipids and  liver function tests given the increase of his Lipitor dosing.       EW/MEDQ  D:  08/19/2004  T:  08/19/2004  Job:  [id number]   cc:   Doreen Beam  [address]  [place]  [place] [address]  Fax: [phone]   Learta Codding, M.D. [facility]

## 2010-10-03 ENCOUNTER — Other Ambulatory Visit: Payer: Self-pay | Admitting: Neurosurgery

## 2010-10-03 DIAGNOSIS — M5126 Other intervertebral disc displacement, lumbar region: Secondary | ICD-10-CM

## 2010-10-05 ENCOUNTER — Ambulatory Visit
Admission: RE | Admit: 2010-10-05 | Discharge: 2010-10-05 | Disposition: A | Discharge status: Home / Self Care | Payer: PRIVATE HEALTH INSURANCE | Source: Ambulatory Visit | Attending: Neurosurgery | Admitting: Neurosurgery

## 2010-10-05 DIAGNOSIS — M5126 Other intervertebral disc displacement, lumbar region: Secondary | ICD-10-CM

## 2010-10-05 MED ORDER — METHYLPREDNISOLONE ACETATE 40 MG/ML INJ SUSP (RADIOLOG
120.0000 mg | Freq: Once | INTRAMUSCULAR | Status: AC
  Administered 2010-10-05: 120 mg via EPIDURAL

## 2010-10-05 MED ORDER — IOHEXOL 180 MG/ML  SOLN
1.0000 mL | Freq: Once | INTRAMUSCULAR | Status: AC | PRN
  Administered 2010-10-05: 1 mL via EPIDURAL

## 2010-12-01 LAB — DIFFERENTIAL
Basophils Relative: 1
Eosinophils Absolute: 0.6
Eosinophils Relative: 7 — ABNORMAL HIGH
Monocytes Absolute: 0.7

## 2010-12-01 LAB — COMPREHENSIVE METABOLIC PANEL
ALT: 24
AST: 29
BUN: 15
Calcium: 10.2
Creatinine, Ser: 0.93
GFR calc Af Amer: >60
Glucose, Bld: 104 — ABNORMAL HIGH
Potassium: 4.3

## 2010-12-01 LAB — URINALYSIS, ROUTINE W REFLEX MICROSCOPIC
Bilirubin Urine: NEGATIVE
Hgb urine dipstick: NEGATIVE
Ketones, ur: NEGATIVE
Nitrite: NEGATIVE
Specific Gravity, Urine: 1.012
Urobilinogen, UA: 1
pH: 8

## 2010-12-01 LAB — TYPE AND SCREEN: Antibody Screen: NEGATIVE

## 2010-12-01 LAB — CBC
HCT: 38.1 — ABNORMAL LOW
MCHC: 34.9
Platelets: 313
RBC: 4.25

## 2010-12-01 LAB — APTT: aPTT: 31

## 2012-01-01 ENCOUNTER — Other Ambulatory Visit: Payer: Self-pay | Admitting: Neurosurgery

## 2012-01-01 DIAGNOSIS — M47816 Spondylosis without myelopathy or radiculopathy, lumbar region: Secondary | ICD-10-CM

## 2012-01-07 ENCOUNTER — Ambulatory Visit
Admission: RE | Admit: 2012-01-07 | Discharge: 2012-01-07 | Disposition: A | Discharge status: Home / Self Care | Payer: Medicare Other | Source: Ambulatory Visit | Attending: Neurosurgery | Admitting: Neurosurgery

## 2012-01-07 VITALS — BP 171/84 | HR 53 | Ht 67.0 in | Wt 200.0 lb

## 2012-01-07 DIAGNOSIS — M47816 Spondylosis without myelopathy or radiculopathy, lumbar region: Secondary | ICD-10-CM

## 2012-01-07 MED ORDER — IOHEXOL 180 MG/ML  SOLN
1.0000 mL | Freq: Once | INTRAMUSCULAR | Status: AC | PRN
  Administered 2012-01-07: 1 mL via EPIDURAL

## 2012-01-07 MED ORDER — METHYLPREDNISOLONE ACETATE 40 MG/ML INJ SUSP (RADIOLOG
120.0000 mg | Freq: Once | INTRAMUSCULAR | Status: AC
  Administered 2012-01-07: 120 mg via EPIDURAL

## 2015-02-02 ENCOUNTER — Other Ambulatory Visit: Payer: Self-pay | Admitting: Neurosurgery

## 2015-02-02 DIAGNOSIS — M542 Cervicalgia: Secondary | ICD-10-CM

## 2019-08-19 DIAGNOSIS — Z299 Encounter for prophylactic measures, unspecified: Secondary | ICD-10-CM | POA: Diagnosis not present

## 2019-08-19 DIAGNOSIS — E7800 Pure hypercholesterolemia, unspecified: Secondary | ICD-10-CM | POA: Diagnosis not present

## 2019-08-19 DIAGNOSIS — I10 Essential (primary) hypertension: Secondary | ICD-10-CM | POA: Diagnosis not present

## 2019-08-19 DIAGNOSIS — E039 Hypothyroidism, unspecified: Secondary | ICD-10-CM | POA: Diagnosis not present

## 2019-08-19 DIAGNOSIS — R5383 Other fatigue: Secondary | ICD-10-CM | POA: Diagnosis not present

## 2019-08-19 DIAGNOSIS — Z1211 Encounter for screening for malignant neoplasm of colon: Secondary | ICD-10-CM | POA: Diagnosis not present

## 2019-08-19 DIAGNOSIS — Z79899 Other long term (current) drug therapy: Secondary | ICD-10-CM | POA: Diagnosis not present

## 2019-08-19 DIAGNOSIS — Z0000 Encounter for general adult medical examination without abnormal findings: Secondary | ICD-10-CM | POA: Diagnosis not present

## 2019-08-19 DIAGNOSIS — Z7189 Other specified counseling: Secondary | ICD-10-CM | POA: Diagnosis not present

## 2019-12-11 ENCOUNTER — Inpatient Hospital Stay (HOSPITAL_COMMUNITY)
Admission: EM | Admit: 2019-12-11 | Discharge: 2019-12-15 | DRG: 251 | Disposition: A | Discharge status: Home Health | Payer: Medicare Other | Attending: Cardiology | Admitting: Cardiology

## 2019-12-11 ENCOUNTER — Inpatient Hospital Stay (HOSPITAL_COMMUNITY)
Admission: EM | Disposition: A | Discharge status: Home Health | Payer: Self-pay | Source: Home / Self Care | Attending: Cardiovascular Disease

## 2019-12-11 DIAGNOSIS — F0390 Unspecified dementia without behavioral disturbance: Secondary | ICD-10-CM

## 2019-12-11 DIAGNOSIS — I351 Nonrheumatic aortic (valve) insufficiency: Secondary | ICD-10-CM | POA: Diagnosis not present

## 2019-12-11 DIAGNOSIS — I441 Atrioventricular block, second degree: Secondary | ICD-10-CM | POA: Diagnosis not present

## 2019-12-11 DIAGNOSIS — S301XXA Contusion of abdominal wall, initial encounter: Secondary | ICD-10-CM | POA: Diagnosis present

## 2019-12-11 DIAGNOSIS — I2119 ST elevation (STEMI) myocardial infarction involving other coronary artery of inferior wall: Secondary | ICD-10-CM | POA: Diagnosis not present

## 2019-12-11 DIAGNOSIS — I071 Rheumatic tricuspid insufficiency: Secondary | ICD-10-CM | POA: Diagnosis present

## 2019-12-11 DIAGNOSIS — I361 Nonrheumatic tricuspid (valve) insufficiency: Secondary | ICD-10-CM | POA: Diagnosis not present

## 2019-12-11 DIAGNOSIS — I358 Other nonrheumatic aortic valve disorders: Secondary | ICD-10-CM | POA: Diagnosis not present

## 2019-12-11 DIAGNOSIS — I10 Essential (primary) hypertension: Secondary | ICD-10-CM | POA: Diagnosis not present

## 2019-12-11 DIAGNOSIS — Y92009 Unspecified place in unspecified non-institutional (private) residence as the place of occurrence of the external cause: Secondary | ICD-10-CM | POA: Diagnosis not present

## 2019-12-11 DIAGNOSIS — I2111 ST elevation (STEMI) myocardial infarction involving right coronary artery: Secondary | ICD-10-CM | POA: Diagnosis not present

## 2019-12-11 DIAGNOSIS — I2510 Atherosclerotic heart disease of native coronary artery without angina pectoris: Secondary | ICD-10-CM | POA: Diagnosis not present

## 2019-12-11 DIAGNOSIS — I213 ST elevation (STEMI) myocardial infarction of unspecified site: Secondary | ICD-10-CM | POA: Diagnosis not present

## 2019-12-11 DIAGNOSIS — E785 Hyperlipidemia, unspecified: Secondary | ICD-10-CM | POA: Diagnosis not present

## 2019-12-11 DIAGNOSIS — F0391 Unspecified dementia with behavioral disturbance: Secondary | ICD-10-CM | POA: Diagnosis not present

## 2019-12-11 DIAGNOSIS — W19XXXA Unspecified fall, initial encounter: Secondary | ICD-10-CM | POA: Diagnosis present

## 2019-12-11 DIAGNOSIS — I4891 Unspecified atrial fibrillation: Secondary | ICD-10-CM | POA: Diagnosis present

## 2019-12-11 DIAGNOSIS — R410 Disorientation, unspecified: Secondary | ICD-10-CM | POA: Diagnosis not present

## 2019-12-11 DIAGNOSIS — E039 Hypothyroidism, unspecified: Secondary | ICD-10-CM | POA: Diagnosis present

## 2019-12-11 DIAGNOSIS — Z20822 Contact with and (suspected) exposure to covid-19: Secondary | ICD-10-CM | POA: Diagnosis present

## 2019-12-11 DIAGNOSIS — N1831 Chronic kidney disease, stage 3a: Secondary | ICD-10-CM | POA: Diagnosis not present

## 2019-12-11 DIAGNOSIS — Z043 Encounter for examination and observation following other accident: Secondary | ICD-10-CM | POA: Diagnosis not present

## 2019-12-11 DIAGNOSIS — Z955 Presence of coronary angioplasty implant and graft: Secondary | ICD-10-CM

## 2019-12-11 DIAGNOSIS — Z86718 Personal history of other venous thrombosis and embolism: Secondary | ICD-10-CM | POA: Diagnosis not present

## 2019-12-11 DIAGNOSIS — N179 Acute kidney failure, unspecified: Secondary | ICD-10-CM | POA: Diagnosis not present

## 2019-12-11 HISTORY — DX: Essential (primary) hypertension: I10

## 2019-12-11 HISTORY — DX: Unspecified dementia, unspecified severity, without behavioral disturbance, psychotic disturbance, mood disturbance, and anxiety: F03.90

## 2019-12-11 HISTORY — DX: Spondylosis without myelopathy or radiculopathy, lumbar region: M47.816

## 2019-12-11 HISTORY — PX: CORONARY/GRAFT ACUTE MI REVASCULARIZATION: CATH118305

## 2019-12-11 HISTORY — PX: LEFT HEART CATH AND CORONARY ANGIOGRAPHY: CATH118249

## 2019-12-11 HISTORY — DX: Malignant neoplasm of prostate: C61

## 2019-12-11 HISTORY — DX: Hypothyroidism, unspecified: E03.9

## 2019-12-11 HISTORY — DX: Acute embolism and thrombosis of unspecified deep veins of unspecified lower extremity: I82.409

## 2019-12-11 HISTORY — DX: Atherosclerotic heart disease of native coronary artery without angina pectoris: I25.10

## 2019-12-11 HISTORY — DX: Hyperlipidemia, unspecified: E78.5

## 2019-12-11 LAB — PROTIME-INR
INR: 1.1 (ref 0.8–1.2)
Prothrombin Time: 13.3 seconds (ref 11.4–15.2)

## 2019-12-11 LAB — APTT: aPTT: 30 seconds (ref 24–36)

## 2019-12-11 SURGERY — CORONARY/GRAFT ACUTE MI REVASCULARIZATION
Anesthesia: LOCAL

## 2019-12-11 MED ORDER — HEPARIN SODIUM (PORCINE) 5000 UNIT/ML IJ SOLN
60.0000 [IU]/kg | Freq: Once | INTRAMUSCULAR | Status: DC
  Administered 2019-12-11: 4000 [IU] via INTRAVENOUS

## 2019-12-11 MED ORDER — VERAPAMIL HCL 2.5 MG/ML IV SOLN
INTRAVENOUS | Status: AC
  Filled 2019-12-11: qty 2

## 2019-12-11 MED ORDER — TIROFIBAN HCL IN NACL 5-0.9 MG/100ML-% IV SOLN
INTRAVENOUS | Status: AC
  Filled 2019-12-11: qty 100

## 2019-12-11 MED ORDER — HEPARIN SODIUM (PORCINE) 5000 UNIT/ML IJ SOLN: 4000.0000 [IU] | Freq: Once | INTRAMUSCULAR | Status: DC

## 2019-12-11 MED ORDER — LIDOCAINE HCL (PF) 1 % IJ SOLN
INTRAMUSCULAR | Status: AC
  Filled 2019-12-11: qty 30

## 2019-12-11 MED ORDER — HEPARIN (PORCINE) IN NACL 1000-0.9 UT/500ML-% IV SOLN
INTRAVENOUS | Status: AC
  Filled 2019-12-11: qty 1000

## 2019-12-11 MED ORDER — HEPARIN (PORCINE) IN NACL 1000-0.9 UT/500ML-% IV SOLN
INTRAVENOUS | Status: DC | PRN
  Administered 2019-12-11 (×2): 500 mL

## 2019-12-11 MED ORDER — ASPIRIN 81 MG PO CHEW: 324.0000 mg | CHEWABLE_TABLET | Freq: Once | ORAL | Status: AC

## 2019-12-11 MED ORDER — SODIUM CHLORIDE 0.9 % IV SOLN
INTRAVENOUS | Status: AC | PRN
  Administered 2019-12-11: 250 mL via INTRAVENOUS

## 2019-12-11 MED ORDER — HEPARIN SODIUM (PORCINE) 1000 UNIT/ML IJ SOLN
INTRAMUSCULAR | Status: AC
  Filled 2019-12-11: qty 1

## 2019-12-11 MED ORDER — LIDOCAINE HCL (PF) 1 % IJ SOLN
INTRAMUSCULAR | Status: DC | PRN
  Administered 2019-12-11: 2 mL

## 2019-12-11 MED ORDER — TICAGRELOR 90 MG PO TABS
ORAL_TABLET | ORAL | Status: DC | PRN
  Administered 2019-12-11: 180 mg via ORAL

## 2019-12-11 MED ORDER — SODIUM CHLORIDE 0.9 % IV SOLN: INTRAVENOUS | Status: DC

## 2019-12-11 MED ORDER — HEPARIN SODIUM (PORCINE) 1000 UNIT/ML IJ SOLN
INTRAMUSCULAR | Status: DC | PRN
  Administered 2019-12-11: 6000 [IU] via INTRAVENOUS

## 2019-12-11 MED ORDER — TIROFIBAN (AGGRASTAT) BOLUS VIA INFUSION
INTRAVENOUS | Status: DC | PRN
  Administered 2019-12-11: 2087.5 ug via INTRAVENOUS

## 2019-12-11 MED ORDER — TICAGRELOR 90 MG PO TABS
ORAL_TABLET | ORAL | Status: AC
  Filled 2019-12-11: qty 2

## 2019-12-11 MED ORDER — VERAPAMIL HCL 2.5 MG/ML IV SOLN
INTRAVENOUS | Status: DC | PRN
  Administered 2019-12-11: 10 mL via INTRA_ARTERIAL

## 2019-12-11 MED ORDER — TIROFIBAN HCL IN NACL 5-0.9 MG/100ML-% IV SOLN
INTRAVENOUS | Status: AC | PRN
  Administered 2019-12-11: 0.075 ug/kg/min via INTRAVENOUS

## 2019-12-11 MED ORDER — NITROGLYCERIN 1 MG/10 ML FOR IR/CATH LAB
INTRA_ARTERIAL | Status: AC
  Filled 2019-12-11: qty 10

## 2019-12-11 SURGICAL SUPPLY — 17 items
BALLN SAPPHIRE 2.5X15 (BALLOONS) ×2
BALLN SAPPHIRE ~~LOC~~ 2.5X15 (BALLOONS) ×2 IMPLANT
BALLOON SAPPHIRE 2.5X15 (BALLOONS) ×1 IMPLANT
CATH 5FR JL3.5 JR4 ANG PIG MP (CATHETERS) ×2 IMPLANT
CATH LAUNCHER 6FR AL.75 (CATHETERS) ×2 IMPLANT
DEVICE RAD COMP TR BAND LRG (VASCULAR PRODUCTS) ×2 IMPLANT
ELECT DEFIB PAD ADLT CADENCE (PAD) ×2 IMPLANT
GLIDESHEATH SLEND SS 6F .021 (SHEATH) ×2 IMPLANT
GUIDEWIRE INQWIRE 1.5J.035X260 (WIRE) ×1 IMPLANT
INQWIRE 1.5J .035X260CM (WIRE) ×2
KIT ENCORE 26 ADVANTAGE (KITS) ×2 IMPLANT
KIT HEART LEFT (KITS) ×2 IMPLANT
PACK CARDIAC CATHETERIZATION (CUSTOM PROCEDURE TRAY) ×2 IMPLANT
TRANSDUCER W/STOPCOCK (MISCELLANEOUS) ×2 IMPLANT
TUBING CIL FLEX 10 FLL-RA (TUBING) ×2 IMPLANT
WIRE ASAHI FIELDER XT 190CM (WIRE) ×2 IMPLANT
WIRE COUGAR XT STRL 190CM (WIRE) ×2 IMPLANT

## 2019-12-11 NOTE — ED Provider Notes (Signed)
[facility]H CARDIOVASCULAR ICU Provider Note  CSN: [id number] Arrival date & time: 12/11/19 2309  Chief Complaint(s) Code STEMI Pt presents to ED BIB [facility]. Per EMS Pt fell, when hooked up to monitor they saw signs of STEMI. Pt denies CP. Pt dementia at baseline. EMS given 324 ASA and 500 IVF. HPI Tony Chapman is a 79 y.o. male with a history of CAD and 2 stents who presents to the emergency department as a code STEMI.  Patient has a history of dementia and unable to provide history.  Patient's phone was ringing and when I picked up it was the patient's son and daughter-in-law.  They report getting a call from the neighbor stating that the neighbor had called was generally weak.  When EMS arrived EKG showed evidence of a STEMI.  Remainder of history, ROS, and physical exam limited due to patient's condition (Dementia). Additional information was obtained from EMS and family.   Level V Caveat.    HPI  Past Medical History No past medical history on file. Patient Active Problem List   Diagnosis Date Noted  . Acute ST elevation myocardial infarction (STEMI) of inferior wall ([place]) 12/12/2019   Home Medication(s) Prior to Admission medications   Not on File                                                                                                                                    Past Surgical History ** The histories are not reviewed yet. Please review them in the "History" navigator section and refresh this [place]. Family History No family history on file.  Social History Social History   Tobacco Use  . Smoking status: Not on file  Substance Use Topics  . Alcohol use: Not on file  . Drug use: Not on file   Allergies Patient has no known allergies.  Review of Systems Review of Systems  Unable to perform ROS: Dementia     Physical Exam Vital Signs  I have reviewed the triage vital signs BP (!) 102/53 (BP Location: Left Arm)   Pulse (!) 45    Temp 98.3 F (36.8 C) (Oral)   Resp 13   Wt 83.5 kg   SpO2 95%   BMI 28.82 kg/m   Physical Exam Vitals reviewed.  Constitutional:      General: He is not in acute distress.    Appearance: He is well-developed. He is not diaphoretic.  HENT:     Head: Normocephalic and atraumatic.     Nose: Nose normal.  Eyes:     General: No scleral icterus.       Right eye: No discharge.        Left eye: No discharge.     Conjunctiva/sclera: Conjunctivae normal.     Pupils: Pupils are equal, round, and reactive to light.  Cardiovascular:     Rate and Rhythm: Normal rate and regular rhythm.  Heart sounds: No murmur heard.  No friction rub. No gallop.   Pulmonary:     Effort: Pulmonary effort is normal. No respiratory distress.     Breath sounds: Normal breath sounds. No stridor. No rales.  Abdominal:     General: There is no distension.     Palpations: Abdomen is soft.     Tenderness: There is no abdominal tenderness.  Musculoskeletal:        General: No tenderness.     Cervical back: Normal range of motion and neck supple.  Skin:    General: Skin is warm and dry.     Findings: No erythema or rash.  Neurological:     Mental Status: He is alert. He is disoriented.     Comments: Oriented to person and place     ED Results and Treatments Labs (all labs ordered are listed, but only abnormal results are displayed) Labs Reviewed  COMPREHENSIVE METABOLIC PANEL - Abnormal; Notable for the following components:      Result Value   Potassium 3.4 (*)    Glucose, Bld 129 (*)    BUN 24 (*)    Creatinine, Ser 1.67 (*)    Albumin 3.4 (*)    AST 79 (*)    ALT 51 (*)    GFR, Estimated 42 (*)    All other components within normal limits  TROPONIN I (HIGH SENSITIVITY) - Abnormal; Notable for the following components:   Troponin I (High Sensitivity) 5,854 (*)    All other components within normal limits  RESPIRATORY PANEL BY RT PCR (FLU A&B, COVID)  MRSA PCR SCREENING  PROTIME-INR    APTT  LIPID PANEL  HEMOGLOBIN A1C  CBC WITH DIFFERENTIAL/PLATELET  CBC WITH DIFFERENTIAL/PLATELET  BASIC METABOLIC PANEL  CBC  TROPONIN I (HIGH SENSITIVITY)                                                                                                                         EKG  EKG Interpretation  Date/Time:  Friday December 11 2019 23:10:25 EDT Ventricular Rate:  70 PR Interval:    QRS Duration: 101 QT Interval:  384 QTC Calculation: 351 R Axis:   -67 Text Interpretation: Atrial fibrillation RSR' in V1 or V2, probably normal variant Inferior infarct, acute (RCA) Probable RV involvement, suggest recording right precordial leads >>> Acute MI <<< Confirmed by Addison Lank [phone]) on 12/11/2019 11:26:45 PM      Radiology CARDIAC CATHETERIZATION  Addendum Date: 12/12/2019    Prox RCA to Mid RCA lesion is 60% stenosed.  Prox RCA lesion is 100% stenosed.  Mid RCA to Dist RCA lesion is 100% stenosed.  Ost Cx to Prox Cx lesion is 100% stenosed.  1st Mrg lesion is 100% stenosed.  Ost LAD to Mid LAD lesion is 40% stenosed.  2nd Diag lesion is 99% stenosed.  Mid LAD lesion is 40% stenosed.  Dist LAD lesion is 99% stenosed.  Balloon angioplasty was performed using a BALLOON  SAPPHIRE 2.5X15.  Post intervention, there is a 80% residual stenosis.  Post intervention, there is a 60% residual stenosis.  1. The LAD is patent to the apex. Moderate calcific disease in the proximal and mid LAD. Severe apical LAD stenosis. The small caliber second diagonal branch has a severe mid stenosis. 2. Chronic occlusion of the ostial Circumflex. 3. Proximal occlusion of the RCA. The entire vessel is heavily calcified. There is a stent in the mid vessel. The distal lesion is felt to be a chronic total occlusion. The distal branches fill from left to right collaterals. Recommendations: Will admit to the ICU. Will continue ASA, Brilinta and statin. Resume other home medications tomorrow after pharmacy  medication review. I will continue Aggrastat for 18 hours. Will review films with the IC team but I do not think further PCI of the RCA is feasible given the chronic occlusion distally and heavy calcification of the entire vessel. He is pain free with a stable blood pressure. Anticipate conservative management from this point given his advanced dementia.   Result Date: 12/12/2019  Prox RCA to Mid RCA lesion is 60% stenosed.  Prox RCA lesion is 100% stenosed.  Mid RCA to Dist RCA lesion is 100% stenosed.  Ost Cx to Prox Cx lesion is 100% stenosed.  1st Mrg lesion is 100% stenosed.  Ost LAD to Mid LAD lesion is 40% stenosed.  2nd Diag lesion is 99% stenosed.  Mid LAD lesion is 40% stenosed.  Dist LAD lesion is 99% stenosed.  1. The LAD is patent to the apex. Moderate calcific disease in the proximal and mid LAD. Severe apical LAD stenosis. The small caliber second diagonal branch has a severe mid stenosis. 2. Chronic occlusion of the ostial Circumflex. 3. Proximal occlusion of the RCA. The entire vessel is heavily calcified. There is a stent in the mid vessel. The distal lesion is felt to be a chronic total occlusion. The distal branches fill from left to right collaterals. Recommendations: Will admit to the ICU. Will continue ASA, Brilinta and statin. Resume other home medications tomorrow after pharmacy medication review. I will continue Aggrastat for 18 hours. Will review films with the IC team but I do not think further PCI of the RCA is feasible given the chronic occlusion distally and heavy calcification of the entire vessel. He is pain free with a stable blood pressure. Anticipate conservative management from this point given his advanced dementia.    Pertinent labs & imaging results that were available during my care of the patient were reviewed by me and considered in my medical decision making (see chart for details).  Medications Ordered in ED Medications  0.9 %  sodium chloride infusion  (has no administration in time range)  heparin injection 4,000 Units ( Intravenous MAR Unhold 12/12/19 0039)  labetalol (NORMODYNE) injection 10 mg (has no administration in time range)  hydrALAZINE (APRESOLINE) injection 10 mg (has no administration in time range)  acetaminophen (TYLENOL) tablet 650 mg (has no administration in time range)  ondansetron (ZOFRAN) injection 4 mg (has no administration in time range)  0.9 %  sodium chloride infusion (has no administration in time range)  sodium chloride flush (NS) 0.9 % injection 3 mL (has no administration in time range)  sodium chloride flush (NS) 0.9 % injection 3 mL (has no administration in time range)  0.9 %  sodium chloride infusion (has no administration in time range)  atorvastatin (LIPITOR) tablet 80 mg (has no administration in time range)  aspirin  chewable tablet 81 mg (has no administration in time range)  ticagrelor (BRILINTA) tablet 90 mg (has no administration in time range)  tirofiban (AGGRASTAT) infusion 50 mcg/mL 100 mL (has no administration in time range)  Chlorhexidine Gluconate Cloth 2 % PADS 6 each (has no administration in time range)  aspirin chewable tablet 324 mg (324 mg Oral Given by EMS 12/11/19 2317)  tirofiban (AGGRASTAT) infusion 50 mcg/mL 100 mL (0.075 mcg/kg/min  83.5 kg Intravenous New Bag/Given 12/11/19 2357)  0.9 %  sodium chloride infusion (250 mLs Intravenous New Bag/Given 12/11/19 2358)                                                                                                                                    Procedures .1-3 Lead EKG Interpretation Performed by: Fatima Blank, MD Authorized by: Fatima Blank, MD     Interpretation: abnormal     ECG rate:  52   ECG rate assessment: bradycardic     Rhythm: atrial fibrillation     Ectopy: none     Conduction: normal   .Critical Care Performed by: Fatima Blank, MD Authorized by: Fatima Blank, MD     CRITICAL CARE Performed by: Grayce Sessions Betzabe Bevans Total critical care time: 30 minutes Critical care time was exclusive of separately billable procedures and treating other patients. Critical care was necessary to treat or prevent imminent or life-threatening deterioration. Critical care was time spent personally by me on the following activities: development of treatment plan with patient and/or surrogate as well as nursing, discussions with consultants, evaluation of patient's response to treatment, examination of patient, obtaining history from patient or surrogate, ordering and performing treatments and interventions, ordering and review of laboratory studies, ordering and review of radiographic studies, pulse oximetry and re-evaluation of patient's condition.    (including critical care time)  Medical Decision Making / ED Course I have reviewed the nursing notes for this encounter and the patient's prior records (if available in EHR or on provided paperwork).   Tony Chapman was evaluated in Emergency Department on 12/12/2019 for the symptoms described in the history of present illness. He was evaluated in the context of the global COVID-19 pandemic, which necessitated consideration that the patient might be at risk for infection with the SARS-CoV-2 virus that causes COVID-19. Institutional protocols and algorithms that pertain to the evaluation of patients at risk for COVID-19 are in a state of rapid change based on information released by regulatory bodies including the CDC and federal and state organizations. These policies and algorithms were followed during the patient's care in the ED.  Patient presented as a code STEMI. EKG here confirms inferior MI.  Currently hemodynamically stable.  Holding nitroglycerin due to soft blood pressures with inferior MI. Spoke with family over the phone who agreed with pursuing PCI.  Cardiology at bedside Agreed to take patient for PCI.  Final Clinical Impression(s) / ED Diagnoses Final diagnoses:  Acute ST elevation myocardial infarction (STEMI) involving right coronary artery [facility])      This chart was dictated using voice recognition software.  Despite best efforts to proofread,  errors can occur which can change the documentation meaning.   Fatima Blank, MD 12/12/19 [phone]

## 2019-12-11 NOTE — ED Triage Notes (Signed)
Pt presents to ED BIB [facility] EMS. Per EMS Pt fell, when hooked up to monitor they saw signs of STEMI. Pt denies CP. Pt dementia at baseline. EMS given 324 ASA and 500 IVF.

## 2019-12-11 NOTE — H&P (Signed)
Cardiology Admission History and Physical:   Patient ID: Tony Chapman MRN: [id number]; DOB: 1941-01-11   Admission date: 12/11/2019  Primary Care Provider: No primary care provider on file. Primary Cardiologist: No primary care provider on file.  Primary Electrophysiologist:  None   Chief Complaint:  STEMI  Patient Profile:   Tony Chapman is a 79 y.o. male with dementia, CAD, hypothyrodism who presents with STEMI but no active chest pain.  History of Present Illness:   Tony Chapman is a 79 yo male with a history of dementia, CAD (unclear what arteries, done at [facility]), hypothyroidism, history of DVT who presents with STEMI. EMS reports that they were called after a fall. When they hooked him up for evaluation they noted ECG pattern consistent with inferior stemi.   Unable to gather much history from the patient given his dementia. However, he does state that he is not actively having chest pain and feels at his baseline. He does note that he would want procedures done to save his life. Dr. Angelena Form (interventional MD) discussed cath with HCPOA (son) and son agreed with procedure. Patient was taken from ED to cath lab in stable condition.    No past medical history on file.     Medications Prior to Admission: Prior to Admission medications   Not on File     Allergies:   No Known Allergies  Social History:   Social History   Socioeconomic History   Marital status: Married    Spouse name: Not on file   Number of children: Not on file   Years of education: Not on file   Highest education level: Not on file  Occupational History   Not on file  Tobacco Use   Smoking status: Not on file  Substance and Sexual Activity   Alcohol use: Not on file   Drug use: Not on file   Sexual activity: Not on file  Other Topics Concern   Not on file  Social History Narrative   Not on file   Social Determinants of Health   Financial Resource Strain:    Difficulty  of Paying Living Expenses: Not on file  Food Insecurity:    Worried About [place] in the Last Year: Not on file   Ran Out of Food in the Last Year: Not on file  Transportation Needs:    Lack of Transportation (Medical): Not on file   Lack of Transportation (Non-Medical): Not on file  Physical Activity:    Days of Exercise per Week: Not on file   Minutes of Exercise per Session: Not on file  Stress:    Feeling of Stress : Not on file  Social Connections:    Frequency of Communication with Friends and Family: Not on file   Frequency of Social Gatherings with Friends and Family: Not on file   Attends Religious Services: Not on file   Active Member of Clubs or Organizations: Not on file   Attends Archivist Meetings: Not on file   Marital Status: Not on file  Intimate Partner Violence:    Fear of Current or Ex-Partner: Not on file   Emotionally Abused: Not on file   Physically Abused: Not on file   Sexually Abused: Not on file    Family History:  Noncontributory  The patient's family history is not on file.    Review of Systems: [y] = yes, [ ]  = no     General: Weight gain [ ] ;  Weight loss [ ] ; Anorexia [ ] ; Fatigue [ ] ; Fever [ ] ; Chills [ ] ; Weakness [ ]    Cardiac: Chest pain/pressure [ ] ; Resting SOB [ ] ; Exertional SOB [ ] ; Orthopnea [ ] ; Pedal Edema [ ] ; Palpitations [ ] ; Syncope [ ] ; Presyncope [ ] ; Paroxysmal nocturnal dyspnea[ ]    Pulmonary: Cough [ ] ; Wheezing[ ] ; Hemoptysis[ ] ; Sputum [ ] ; Snoring [ ]    GI: Vomiting[ ] ; Dysphagia[ ] ; Melena[ ] ; Hematochezia [ ] ; Heartburn[ ] ; Abdominal pain [ ] ; Constipation [ ] ; Diarrhea [ ] ; BRBPR [ ]    GU: Hematuria[ ] ; Dysuria [ ] ; Nocturia[ ]    Vascular: Pain in legs with walking [ ] ; Pain in feet with lying flat [ ] ; Non-healing sores [ ] ; Stroke [ ] ; TIA [ ] ; Slurred speech [ ] ;   Neuro: Headaches[ ] ; Vertigo[ ] ; Seizures[ ] ; Paresthesias[ ] ;Blurred vision [ ] ; Diplopia [ ] ; Vision changes  [ ]    Ortho/Skin: Arthritis [ ] ; Joint pain [ ] ; Muscle pain [ ] ; Joint swelling [ ] ; Back Pain [ ] ; Rash [ ]    Psych: Depression[ ] ; Anxiety[ ]    Heme: Bleeding problems [ ] ; Clotting disorders [ ] ; Anemia [ ]    Endocrine: Diabetes [ ] ; Thyroid dysfunction[ ]   Physical Exam/Data:   Vitals:   12/11/19 2310 12/11/19 2315  BP: 110/78 108/60  Resp: 14 17   No intake or output data in the 24 hours ending 12/11/19 2331 There were no vitals filed for this visit. There is no height or weight on file to calculate BMI.  General:  nad HEENT: normal Lymph: no adenopathy Neck: no JVD Endocrine:  No thryomegaly Vascular: No carotid bruits; FA pulses 2+ bilaterally without bruits  Cardiac:  normal S1, S2; RRR; soft systolic murmur  Lungs:  clear to auscultation bilaterally, no wheezing, rhonchi or rales  Abd: soft, nontender, no hepatomegaly  Ext: no edema Musculoskeletal:  No deformities, BUE and BLE strength normal and equal Skin: warm and dry  Neuro:  CNs 2-12 intact, no focal abnormalities noted Psych:  Normal affect    EKG:  The ECG that was done  was personally reviewed and demonstrates deep ST depression in V2 and V3, c/w possible inferior stemi  Relevant CV Studies: none  Laboratory Data:  ChemistryNo results for input(s): NA, K, CL, CO2, GLUCOSE, BUN, CREATININE, CALCIUM, GFRNONAA, GFRAA, ANIONGAP in the last 168 hours.  No results for input(s): PROT, ALBUMIN, AST, ALT, ALKPHOS, BILITOT in the last 168 hours. HematologyNo results for input(s): WBC, RBC, HGB, HCT, MCV, MCH, MCHC, RDW, PLT in the last 168 hours. Cardiac EnzymesNo results for input(s): TROPONINI in the last 168 hours. No results for input(s): TROPIPOC in the last 168 hours.  BNPNo results for input(s): BNP, PROBNP in the last 168 hours.  DDimer No results for input(s): DDIMER in the last 168 hours.  Radiology/Studies:  No results found.  Assessment and Plan:   1. STEMI. Although he is not having active  chest pain, his dementia makes it impossible to gather a thorough history. With that and concern for inferior STEMI on ECG, he was taken to the cath lab. He was started on heparin Gtt. Received asa load en route with EMS. Will continue asa, already on atorva, benazapril, amlodipine, lasix. Order echo in the AM. P2Y12 pending findings in the lab. 2. HTN. Continue benazapril and amlodipine 3. Dementia. Continue donepazil and mematine  4. Hypothyroidism. Continue synthroid   Severity of Illness: The appropriate patient status for  this patient is INPATIENT. Inpatient status is judged to be reasonable and necessary in order to provide the required intensity of service to ensure the patient's safety. The patient's presenting symptoms, physical exam findings, and initial radiographic and laboratory data in the context of their chronic comorbidities is felt to place them at high risk for further clinical deterioration. Furthermore, it is not anticipated that the patient will be medically stable for discharge from the hospital within 2 midnights of admission. The following factors support the patient status of inpatient.   " The patient's presenting symptoms include stemi " The worrisome physical exam findings include none. " The initial radiographic and laboratory data are worrisome because of ECG findings of STEMi. " The chronic co-morbidities include HTN, CAD, dementia .   * I certify that at the point of admission it is my clinical judgment that the patient will require inpatient hospital care spanning beyond 2 midnights from the point of admission due to high intensity of service, high risk for further deterioration and high frequency of surveillance required.*    For questions or updates, please contact [place] Please consult [email or web address] for contact info under        Signed, Doyne Keel, MD  12/11/2019 11:31 PM

## 2019-12-11 NOTE — Progress Notes (Signed)
   12/11/19 2251  Clinical Encounter Type  Visited With Patient not available  Visit Type Trauma  Referral From Nurse  Consult/Referral To Chaplain  Chaplain responded. Patient is being treated by medical team. The patient's family is not present. This note was prepared by Jeanine Luz, M.Div..  For questions please contact by phone [phone].

## 2019-12-12 ENCOUNTER — Inpatient Hospital Stay (HOSPITAL_COMMUNITY): Payer: Medicare Other

## 2019-12-12 ENCOUNTER — Other Ambulatory Visit: Payer: Self-pay

## 2019-12-12 ENCOUNTER — Encounter (HOSPITAL_COMMUNITY): Payer: Self-pay | Admitting: Cardiovascular Disease

## 2019-12-12 DIAGNOSIS — W19XXXA Unspecified fall, initial encounter: Secondary | ICD-10-CM | POA: Diagnosis present

## 2019-12-12 DIAGNOSIS — I2119 ST elevation (STEMI) myocardial infarction involving other coronary artery of inferior wall: Secondary | ICD-10-CM | POA: Diagnosis not present

## 2019-12-12 DIAGNOSIS — I2111 ST elevation (STEMI) myocardial infarction involving right coronary artery: Secondary | ICD-10-CM | POA: Diagnosis not present

## 2019-12-12 DIAGNOSIS — E039 Hypothyroidism, unspecified: Secondary | ICD-10-CM | POA: Diagnosis not present

## 2019-12-12 DIAGNOSIS — F0390 Unspecified dementia without behavioral disturbance: Secondary | ICD-10-CM | POA: Diagnosis present

## 2019-12-12 DIAGNOSIS — Z20822 Contact with and (suspected) exposure to covid-19: Secondary | ICD-10-CM | POA: Diagnosis not present

## 2019-12-12 DIAGNOSIS — I071 Rheumatic tricuspid insufficiency: Secondary | ICD-10-CM | POA: Diagnosis not present

## 2019-12-12 DIAGNOSIS — I361 Nonrheumatic tricuspid (valve) insufficiency: Secondary | ICD-10-CM | POA: Diagnosis not present

## 2019-12-12 DIAGNOSIS — Y92009 Unspecified place in unspecified non-institutional (private) residence as the place of occurrence of the external cause: Secondary | ICD-10-CM | POA: Diagnosis not present

## 2019-12-12 DIAGNOSIS — I2510 Atherosclerotic heart disease of native coronary artery without angina pectoris: Secondary | ICD-10-CM

## 2019-12-12 DIAGNOSIS — I358 Other nonrheumatic aortic valve disorders: Secondary | ICD-10-CM | POA: Diagnosis not present

## 2019-12-12 DIAGNOSIS — S301XXA Contusion of abdominal wall, initial encounter: Secondary | ICD-10-CM | POA: Diagnosis present

## 2019-12-12 DIAGNOSIS — Z043 Encounter for examination and observation following other accident: Secondary | ICD-10-CM | POA: Diagnosis not present

## 2019-12-12 DIAGNOSIS — I4891 Unspecified atrial fibrillation: Secondary | ICD-10-CM | POA: Diagnosis present

## 2019-12-12 DIAGNOSIS — I441 Atrioventricular block, second degree: Secondary | ICD-10-CM | POA: Diagnosis not present

## 2019-12-12 DIAGNOSIS — N179 Acute kidney failure, unspecified: Secondary | ICD-10-CM | POA: Diagnosis not present

## 2019-12-12 DIAGNOSIS — I351 Nonrheumatic aortic (valve) insufficiency: Secondary | ICD-10-CM | POA: Diagnosis not present

## 2019-12-12 DIAGNOSIS — R410 Disorientation, unspecified: Secondary | ICD-10-CM | POA: Diagnosis not present

## 2019-12-12 DIAGNOSIS — I10 Essential (primary) hypertension: Secondary | ICD-10-CM | POA: Diagnosis not present

## 2019-12-12 DIAGNOSIS — F0391 Unspecified dementia with behavioral disturbance: Secondary | ICD-10-CM | POA: Diagnosis not present

## 2019-12-12 DIAGNOSIS — Z86718 Personal history of other venous thrombosis and embolism: Secondary | ICD-10-CM | POA: Diagnosis not present

## 2019-12-12 DIAGNOSIS — E785 Hyperlipidemia, unspecified: Secondary | ICD-10-CM | POA: Diagnosis not present

## 2019-12-12 LAB — CBC WITH DIFFERENTIAL/PLATELET
Abs Immature Granulocytes: 0.05 10*3/uL (ref 0.00–0.07)
Basophils Absolute: 0.1 10*3/uL (ref 0.0–0.1)
Basophils Relative: 0 %
Eosinophils Absolute: 0.1 10*3/uL (ref 0.0–0.5)
Eosinophils Relative: 1 %
HCT: 36.8 % — ABNORMAL LOW (ref 39.0–52.0)
Hemoglobin: 11.8 g/dL — ABNORMAL LOW (ref 13.0–17.0)
Immature Granulocytes: 0 %
Lymphocytes Relative: 19 %
Lymphs Abs: 2.2 10*3/uL (ref 0.7–4.0)
MCH: 30.6 pg (ref 26.0–34.0)
MCHC: 32.1 g/dL (ref 30.0–36.0)
MCV: 95.3 fL (ref 80.0–100.0)
Monocytes Absolute: 1.2 10*3/uL — ABNORMAL HIGH (ref 0.1–1.0)
Monocytes Relative: 10 %
Neutro Abs: 8.3 10*3/uL — ABNORMAL HIGH (ref 1.7–7.7)
Neutrophils Relative %: 70 %
Platelets: 225 10*3/uL (ref 150–400)
RBC: 3.86 MIL/uL — ABNORMAL LOW (ref 4.22–5.81)
RDW: 13.2 % (ref 11.5–15.5)
WBC: 11.9 10*3/uL — ABNORMAL HIGH (ref 4.0–10.5)
nRBC: 0 % (ref 0.0–0.2)

## 2019-12-12 LAB — COMPREHENSIVE METABOLIC PANEL
ALT: 51 U/L — ABNORMAL HIGH (ref 0–44)
AST: 79 U/L — ABNORMAL HIGH (ref 15–41)
Albumin: 3.4 g/dL — ABNORMAL LOW (ref 3.5–5.0)
Alkaline Phosphatase: 85 U/L (ref 38–126)
Anion gap: 12 (ref 5–15)
BUN: 24 mg/dL — ABNORMAL HIGH (ref 8–23)
CO2: 24 mmol/L (ref 22–32)
Calcium: 10 mg/dL (ref 8.9–10.3)
Chloride: 107 mmol/L (ref 98–111)
Creatinine, Ser: 1.67 mg/dL — ABNORMAL HIGH (ref 0.61–1.24)
GFR, Estimated: 42 mL/min — ABNORMAL LOW (ref >60)
Glucose, Bld: 129 mg/dL — ABNORMAL HIGH (ref 70–99)
Potassium: 3.4 mmol/L — ABNORMAL LOW (ref 3.5–5.1)
Sodium: 143 mmol/L (ref 135–145)
Total Bilirubin: 0.4 mg/dL (ref 0.3–1.2)
Total Protein: 6.6 g/dL (ref 6.5–8.1)

## 2019-12-12 LAB — ECHOCARDIOGRAM COMPLETE
Area-P 1/2: 3.99 cm2
Height: 67 in
S' Lateral: 4 cm
Weight: 2825.42 oz

## 2019-12-12 LAB — BASIC METABOLIC PANEL
Anion gap: 8 (ref 5–15)
BUN: 24 mg/dL — ABNORMAL HIGH (ref 8–23)
CO2: 25 mmol/L (ref 22–32)
Calcium: 9.6 mg/dL (ref 8.9–10.3)
Chloride: 108 mmol/L (ref 98–111)
Creatinine, Ser: 1.37 mg/dL — ABNORMAL HIGH (ref 0.61–1.24)
GFR, Estimated: 53 mL/min — ABNORMAL LOW (ref >60)
Glucose, Bld: 117 mg/dL — ABNORMAL HIGH (ref 70–99)
Potassium: 4 mmol/L (ref 3.5–5.1)
Sodium: 141 mmol/L (ref 135–145)

## 2019-12-12 LAB — RESPIRATORY PANEL BY RT PCR (FLU A&B, COVID)
Influenza A by PCR: NEGATIVE
Influenza B by PCR: NEGATIVE
SARS Coronavirus 2 by RT PCR: NEGATIVE

## 2019-12-12 LAB — MRSA PCR SCREENING: MRSA by PCR: NEGATIVE

## 2019-12-12 LAB — LIPID PANEL
Cholesterol: 161 mg/dL (ref 0–200)
HDL: 49 mg/dL (ref >40)
LDL Cholesterol: 84 mg/dL (ref 0–99)
Total CHOL/HDL Ratio: 3.3 RATIO
Triglycerides: 139 mg/dL (ref <150)
VLDL: 28 mg/dL (ref 0–40)

## 2019-12-12 LAB — TROPONIN I (HIGH SENSITIVITY)
Troponin I (High Sensitivity): 5854 ng/L (ref <18)
Troponin I (High Sensitivity): 9063 ng/L (ref <18)

## 2019-12-12 MED ORDER — ASPIRIN 81 MG PO CHEW
81.0000 mg | CHEWABLE_TABLET | Freq: Every day | ORAL | Status: DC
  Administered 2019-12-12 – 2019-12-15 (×4): 81 mg via ORAL
  Filled 2019-12-12 (×4): qty 1

## 2019-12-12 MED ORDER — SODIUM CHLORIDE 0.9% FLUSH
3.0000 mL | Freq: Two times a day (BID) | INTRAVENOUS | Status: DC
  Administered 2019-12-12 – 2019-12-14 (×6): 3 mL via INTRAVENOUS

## 2019-12-12 MED ORDER — SODIUM CHLORIDE 0.9 % IV SOLN: INTRAVENOUS | Status: AC

## 2019-12-12 MED ORDER — HYDRALAZINE HCL 20 MG/ML IJ SOLN: 10.0000 mg | INTRAMUSCULAR | Status: AC | PRN

## 2019-12-12 MED ORDER — LORAZEPAM 2 MG/ML IJ SOLN
0.5000 mg | Freq: Once | INTRAMUSCULAR | Status: AC
  Administered 2019-12-12: 0.5 mg via INTRAVENOUS
  Filled 2019-12-12: qty 1

## 2019-12-12 MED ORDER — SODIUM CHLORIDE 0.9 % IV SOLN: 250.0000 mL | INTRAVENOUS | Status: DC | PRN

## 2019-12-12 MED ORDER — TICAGRELOR 90 MG PO TABS
90.0000 mg | ORAL_TABLET | Freq: Two times a day (BID) | ORAL | Status: DC
  Administered 2019-12-13 – 2019-12-15 (×5): 90 mg via ORAL
  Filled 2019-12-12 (×5): qty 1

## 2019-12-12 MED ORDER — ONDANSETRON HCL 4 MG/2ML IJ SOLN: 4.0000 mg | Freq: Four times a day (QID) | INTRAMUSCULAR | Status: DC | PRN

## 2019-12-12 MED ORDER — CHLORHEXIDINE GLUCONATE CLOTH 2 % EX PADS
6.0000 | MEDICATED_PAD | Freq: Every day | CUTANEOUS | Status: DC
  Administered 2019-12-12 – 2019-12-14 (×3): 6 via TOPICAL

## 2019-12-12 MED ORDER — ACETAMINOPHEN 325 MG PO TABS
650.0000 mg | ORAL_TABLET | ORAL | Status: DC | PRN
  Administered 2019-12-13: 650 mg via ORAL
  Filled 2019-12-12: qty 2

## 2019-12-12 MED ORDER — SODIUM CHLORIDE 0.9% FLUSH
3.0000 mL | INTRAVENOUS | Status: DC | PRN
  Administered 2019-12-13: 3 mL via INTRAVENOUS

## 2019-12-12 MED ORDER — LABETALOL HCL 5 MG/ML IV SOLN: 10.0000 mg | INTRAVENOUS | Status: DC | PRN

## 2019-12-12 MED ORDER — ATORVASTATIN CALCIUM 80 MG PO TABS
80.0000 mg | ORAL_TABLET | Freq: Every day | ORAL | Status: DC
  Administered 2019-12-12 – 2019-12-15 (×4): 80 mg via ORAL
  Filled 2019-12-12 (×5): qty 1

## 2019-12-12 MED ORDER — TIROFIBAN HCL IN NACL 5-0.9 MG/100ML-% IV SOLN
0.0750 ug/kg/min | INTRAVENOUS | Status: AC
  Administered 2019-12-12: 0.075 ug/kg/min via INTRAVENOUS
  Filled 2019-12-12 (×2): qty 100

## 2019-12-12 MED ORDER — IOHEXOL 350 MG/ML SOLN
INTRAVENOUS | Status: DC | PRN
  Administered 2019-12-12: 50 mL

## 2019-12-12 NOTE — Progress Notes (Signed)
  Echocardiogram 2D Echocardiogram has been performed.  Tony Chapman 12/12/2019, 1:38 PM

## 2019-12-12 NOTE — Plan of Care (Signed)

## 2019-12-12 NOTE — Progress Notes (Signed)
Doing well s/p PCI  He has had some transient second degree AV block.  This could be from inferior MI.  We will keep in Center and follow this closely over the weekend.  Currently, no indication for temporary pacing.    Hold beta blockers at this time.  Thompson Grayer MD, [facility] 12/12/2019 9:09 AM

## 2019-12-13 DIAGNOSIS — R410 Disorientation, unspecified: Secondary | ICD-10-CM

## 2019-12-13 DIAGNOSIS — F0391 Unspecified dementia with behavioral disturbance: Secondary | ICD-10-CM | POA: Diagnosis not present

## 2019-12-13 DIAGNOSIS — I2119 ST elevation (STEMI) myocardial infarction involving other coronary artery of inferior wall: Secondary | ICD-10-CM | POA: Diagnosis not present

## 2019-12-13 LAB — POCT ACTIVATED CLOTTING TIME: Activated Clotting Time: 279 seconds

## 2019-12-13 MED ORDER — ALPRAZOLAM 0.25 MG PO TABS
0.2500 mg | ORAL_TABLET | Freq: Once | ORAL | Status: AC
  Administered 2019-12-13: 0.25 mg via ORAL
  Filled 2019-12-13: qty 1

## 2019-12-13 NOTE — Progress Notes (Signed)
Progress Note   Subjective   The patient has been very agitated.  His daughter is at bedside.  + delirium.  He received ativan and is now sleeping soundly.  Inpatient Medications    Scheduled Meds: . aspirin  81 mg Oral Daily  . atorvastatin  80 mg Oral Daily  . Chlorhexidine Gluconate Cloth  6 each Topical Daily  . heparin  4,000 Units Intravenous Once  . sodium chloride flush  3 mL Intravenous Q12H  . ticagrelor  90 mg Oral BID   Continuous Infusions: . sodium chloride 20 mL/hr at 12/12/19 0940  . sodium chloride     PRN Meds: sodium chloride, acetaminophen, ondansetron (ZOFRAN) IV, sodium chloride flush   Vital Signs    Vitals:   12/13/19 0310 12/13/19 0400 12/13/19 0500 12/13/19 0600  BP:  111/75 (!) 107/54 98/66  Pulse:  (!) 57 (!) 53 (!) 42  Resp:  18 11 17   Temp:  98.6 F (37 C)    TempSrc:  Axillary    SpO2:  95% 99% 100%  Weight: 81.7 kg     Height:        Intake/Output Summary (Last 24 hours) at 12/13/2019 0825 Last data filed at 12/12/2019 2000 Gross per 24 hour  Intake 139.47 ml  Output 680 ml  Net -540.53 ml   Filed Weights   12/11/19 2331 12/12/19 0100 12/13/19 0310  Weight: 83.5 kg 80.1 kg 81.7 kg    Telemetry    Sinus with mobitz I second degree AV block - Personally Reviewed  Physical Exam   GEN- The patient is elderly appearing, sleeping but rouses  Head- normocephalic, atraumatic Eyes-  Sclera clear, conjunctiva pink Ears- hearing intact Oropharynx- clear Neck- supple, Lungs-  normal work of breathing Heart- Regular rate and rhythm  GI- soft  Extremities- no clubbing, cyanosis, or edema     Labs    Chemistry Recent Labs  Lab 12/11/19 2315 12/12/19 0327  NA 143 141  K 3.4* 4.0  CL 107 108  CO2 24 25  GLUCOSE 129* 117*  BUN 24* 24*  CREATININE 1.67* 1.37*  CALCIUM 10.0 9.6  PROT 6.6  --   ALBUMIN 3.4*  --   AST 79*  --   ALT 51*  --   ALKPHOS 85  --   BILITOT 0.4  --   GFRNONAA 42* 53*  ANIONGAP 12 8       Hematology Recent Labs  Lab 12/12/19 0327  WBC 11.9*  RBC 3.86*  HGB 11.8*  HCT 36.8*  MCV 95.3  MCH 30.6  MCHC 32.1  RDW 13.2  PLT 225     Patient ID  Tony Chapman is a 79 y.o. male with dementia, CAD, hypothyrodism who presents with STEMI but no active chest pain.  Assessment & Plan    1.  Inferior STEMI S/p cath 10/22 He has advanced CAD.  Balloon angioplasty was performed however there was substantial residual stenosis Continue ASA and Brilinta Continue high intensity statin Dr Angelena Form is planning (per his note) to review fils with IC team to consider additional PCI of RCA (CTO).  However his cath note suggests conservative management is most likely  2. Second degree AV block Mobitz I now Hopefully will resolve s/p inferior MI Hold beta blocker therapy He is currently sleeping and having mobitz I second degree AV block.  The nurse reports that he has better heart rates when aware. I spoke at length with his daughter.  We  both agree that he would not do well with pacing given delirium/ agitation.  She would prefer a conservative approach if able.  3. HTN Stable No change required today  4. Dementia He has been agitated overnight Now sleeping after ativan I am worried that he may not do well at home PT/OT to assess Daughter would like for him to go home if able.  Thompson Grayer MD, [facility] 12/13/2019 8:25 AM

## 2019-12-13 NOTE — Plan of Care (Signed)

## 2019-12-14 ENCOUNTER — Encounter (HOSPITAL_COMMUNITY): Payer: Self-pay | Admitting: Cardiovascular Disease

## 2019-12-14 ENCOUNTER — Inpatient Hospital Stay (HOSPITAL_COMMUNITY): Payer: Medicare Other

## 2019-12-14 DIAGNOSIS — N179 Acute kidney failure, unspecified: Secondary | ICD-10-CM

## 2019-12-14 DIAGNOSIS — N1831 Chronic kidney disease, stage 3a: Secondary | ICD-10-CM

## 2019-12-14 DIAGNOSIS — I441 Atrioventricular block, second degree: Secondary | ICD-10-CM | POA: Diagnosis not present

## 2019-12-14 DIAGNOSIS — I2119 ST elevation (STEMI) myocardial infarction involving other coronary artery of inferior wall: Secondary | ICD-10-CM | POA: Diagnosis not present

## 2019-12-14 DIAGNOSIS — I10 Essential (primary) hypertension: Secondary | ICD-10-CM | POA: Diagnosis not present

## 2019-12-14 LAB — POCT I-STAT, CHEM 8
BUN: 25 mg/dL — ABNORMAL HIGH (ref 8–23)
Calcium, Ion: 1.33 mmol/L (ref 1.15–1.40)
Chloride: 107 mmol/L (ref 98–111)
Creatinine, Ser: 1.3 mg/dL — ABNORMAL HIGH (ref 0.61–1.24)
Glucose, Bld: 127 mg/dL — ABNORMAL HIGH (ref 70–99)
HCT: 35 % — ABNORMAL LOW (ref 39.0–52.0)
Hemoglobin: 11.9 g/dL — ABNORMAL LOW (ref 13.0–17.0)
Potassium: 3.4 mmol/L — ABNORMAL LOW (ref 3.5–5.1)
Sodium: 142 mmol/L (ref 135–145)
TCO2: 25 mmol/L (ref 22–32)

## 2019-12-14 LAB — BASIC METABOLIC PANEL
Anion gap: 8 (ref 5–15)
BUN: 9 mg/dL (ref 8–23)
CO2: 25 mmol/L (ref 22–32)
Calcium: 9.3 mg/dL (ref 8.9–10.3)
Chloride: 108 mmol/L (ref 98–111)
Creatinine, Ser: 0.99 mg/dL (ref 0.61–1.24)
GFR, Estimated: >60 mL/min (ref >60)
Glucose, Bld: 140 mg/dL — ABNORMAL HIGH (ref 70–99)
Potassium: 3 mmol/L — ABNORMAL LOW (ref 3.5–5.1)
Sodium: 141 mmol/L (ref 135–145)

## 2019-12-14 LAB — POCT I-STAT 7, (LYTES, BLD GAS, ICA,H+H)
Acid-Base Excess: 1 mmol/L (ref 0.0–2.0)
Bicarbonate: 25.7 mmol/L (ref 20.0–28.0)
Calcium, Ion: 1.37 mmol/L (ref 1.15–1.40)
HCT: 35 % — ABNORMAL LOW (ref 39.0–52.0)
Hemoglobin: 11.9 g/dL — ABNORMAL LOW (ref 13.0–17.0)
O2 Saturation: 98 %
Potassium: 3.4 mmol/L — ABNORMAL LOW (ref 3.5–5.1)
Sodium: 143 mmol/L (ref 135–145)
TCO2: 27 mmol/L (ref 22–32)
pCO2 arterial: 39.8 mmHg (ref 32.0–48.0)
pH, Arterial: 7.418 (ref 7.350–7.450)
pO2, Arterial: 107 mmHg (ref 83.0–108.0)

## 2019-12-14 LAB — HEMOGLOBIN A1C
Hgb A1c MFr Bld: 5.7 % — ABNORMAL HIGH (ref 4.8–5.6)
Mean Plasma Glucose: 117 mg/dL

## 2019-12-14 MED ORDER — LEVOTHYROXINE SODIUM 100 MCG PO TABS
100.0000 ug | ORAL_TABLET | Freq: Every day | ORAL | Status: DC
  Administered 2019-12-14 – 2019-12-15 (×2): 100 ug via ORAL
  Filled 2019-12-14 (×2): qty 1

## 2019-12-14 MED ORDER — AMLODIPINE BESYLATE 5 MG PO TABS: 5.0000 mg | ORAL_TABLET | Freq: Every day | ORAL | Status: DC

## 2019-12-14 MED ORDER — ISOSORBIDE MONONITRATE ER 30 MG PO TB24
30.0000 mg | ORAL_TABLET | Freq: Every day | ORAL | Status: DC
  Administered 2019-12-14 – 2019-12-15 (×2): 30 mg via ORAL
  Filled 2019-12-14 (×2): qty 1

## 2019-12-14 MED ORDER — POTASSIUM CHLORIDE CRYS ER 20 MEQ PO TBCR
40.0000 meq | EXTENDED_RELEASE_TABLET | Freq: Once | ORAL | Status: AC
  Administered 2019-12-14: 40 meq via ORAL
  Filled 2019-12-14: qty 2

## 2019-12-14 MED ORDER — ALPRAZOLAM 0.5 MG PO TABS
0.5000 mg | ORAL_TABLET | Freq: Two times a day (BID) | ORAL | Status: DC | PRN
  Administered 2019-12-14: 0.5 mg via ORAL
  Filled 2019-12-14: qty 1

## 2019-12-14 MED ORDER — DIPHENHYDRAMINE HCL 12.5 MG/5ML PO ELIX
12.5000 mg | ORAL_SOLUTION | Freq: Once | ORAL | Status: AC
  Administered 2019-12-14: 12.5 mg via ORAL
  Filled 2019-12-14: qty 5

## 2019-12-14 MED ORDER — AMLODIPINE BESYLATE 5 MG PO TABS
5.0000 mg | ORAL_TABLET | Freq: Every day | ORAL | Status: DC
  Administered 2019-12-14 – 2019-12-15 (×2): 5 mg via ORAL
  Filled 2019-12-14 (×2): qty 1

## 2019-12-14 MED FILL — Nitroglycerin IV Soln 100 MCG/ML in D5W: INTRA_ARTERIAL | Qty: 10 | Status: AC

## 2019-12-14 NOTE — Progress Notes (Signed)
Progress Note   Subjective   Awake and alert. No complaints. Tony Chapman is at bedside.   Inpatient Medications    Scheduled Meds: . aspirin  81 mg Oral Daily  . atorvastatin  80 mg Oral Daily  . Chlorhexidine Gluconate Cloth  6 each Topical Daily  . heparin  4,000 Units Intravenous Once  . isosorbide mononitrate  30 mg Oral Daily  . sodium chloride flush  3 mL Intravenous Q12H  . ticagrelor  90 mg Oral BID   Continuous Infusions: . sodium chloride 20 mL/hr at 12/12/19 0940  . sodium chloride     PRN Meds: sodium chloride, acetaminophen, ondansetron (ZOFRAN) IV, sodium chloride flush   Vital Signs    Vitals:   12/14/19 0600 12/14/19 0700 12/14/19 0746 12/14/19 0800  BP: 131/76 (!) 120/57  112/67  Pulse: 71 76  75  Resp: (!) 21 13  15   Temp:   98 F (36.7 C)   TempSrc:   Oral   SpO2: 98% 99%  99%  Weight:      Height:        Intake/Output Summary (Last 24 hours) at 12/14/2019 0850 Last data filed at 12/14/2019 0800 Gross per 24 hour  Intake 540 ml  Output 1550 ml  Net -1010 ml   Filed Weights   12/12/19 0100 12/13/19 0310 12/14/19 0400  Weight: 80.1 kg 81.7 kg 80.6 kg    Telemetry    Sinus with mobitz I second degree AV block - Personally Reviewed  Physical Exam   GEN- The patient is elderly appearing, alert Head- normocephalic, atraumatic Eyes-  Sclera clear, conjunctiva pink Ears- hearing intact Oropharynx- clear Neck- supple, Lungs-  normal work of breathing Heart- Regular rate and rhythm  GI- soft  Extremities- no clubbing, cyanosis, or edema He does have a flank hematoma. No hip pain. Some back pain.    Labs    Chemistry Recent Labs  Lab 12/11/19 2315 12/12/19 0327  NA 143 141  K 3.4* 4.0  CL 107 108  CO2 24 25  GLUCOSE 129* 117*  BUN 24* 24*  CREATININE 1.67* 1.37*  CALCIUM 10.0 9.6  PROT 6.6  --   ALBUMIN 3.4*  --   AST 79*  --   ALT 51*  --   ALKPHOS 85  --   BILITOT 0.4  --   GFRNONAA 42* 53*  ANIONGAP 12 8      Hematology Recent Labs  Lab 12/12/19 0327  WBC 11.9*  RBC 3.86*  HGB 11.8*  HCT 36.8*  MCV 95.3  MCH 30.6  MCHC 32.1  RDW 13.2  PLT 225     Patient ID  Tony Chapman is a 79 y.o. male with dementia, CAD, hypothyrodism who presents with STEMI but no active chest pain.  Assessment & Plan    1.  Inferior STEMI S/p cath 10/22 He has advanced CAD.  Balloon angioplasty was performed of the proximal RCA with poor result and occluded vessel distally. Vessel is severely calcified with diffuse disease.  Continue ASA and Brilinta Continue high intensity statin I have personally reviewed films. He is a poor candidate for further PCI from an anatomic standpoint and given his advanced dementia would favor a conservative approach. Will resume amlodipine 5 mg daily. Add Imdur 30 mg daily Avoid beta blocker due to AV block.  Transfer to telemetry today and advance activity.  2. Second degree AV block Mobitz I now Hopefully will resolve s/p inferior MI Hold beta blocker  therapy  3. HTN Stable See above. Will hold ACEi for now. Repeat BMET today.  4. Dementia Agitation improved.  PT/OT to assess Daughter would like for him to go home if able.  5. Fall at home  Hematoma left flank Will check low back spine films. Doubt fracture.   Alonnah Lampkins [place] MD, [facility]   12/14/2019 8:50 AM

## 2019-12-14 NOTE — Evaluation (Signed)
Occupational Therapy Evaluation Patient Details Name: Tony Chapman MRN: [id number] DOB: [birth year] Today's Date: 12/14/2019    History of Present Illness Pt is a 79 year old man admitted on 12/12/19 with STEMI. PMH: dementia, CAD, hypothyroidism, HTN, DVT.   Clinical Impression   Pt was independent in self care, mobility and light meal prep prior to admission. His family assisted with IADL. Pt historically refuses to use any DME. He lives alone, but a friend or his family check on his daily. Pt presents with impaired cognition, generalized weakness and unsteady gait. He overall requires min assist. Pt's family think he will recover better at home and are prepared to provide 24 hour care either at the pt's home or his son's. Will follow acutely.    Follow Up Recommendations  Home health OT;Supervision/Assistance - 24 hour    Equipment Recommendations  None recommended by OT    Recommendations for Other Services       Precautions / Restrictions Precautions Precautions: Fall      Mobility Bed Mobility Overal bed mobility: Needs Assistance Bed Mobility: Supine to Sit     Supine to sit: Min assist     General bed mobility comments: use of bed pad for hips to EOB, HOB up, increased time    Transfers Overall transfer level: Needs assistance Equipment used: None Transfers: Sit to/from Stand Sit to Stand: Min assist         General transfer comment: assist to rise and steady    Balance Overall balance assessment: Needs assistance   Sitting balance-Leahy Scale: Fair       Standing balance-Leahy Scale: Poor                             ADL either performed or assessed with clinical judgement   ADL Overall ADL's : Needs assistance/impaired Eating/Feeding: Set up;Sitting   Grooming: Minimal assistance;Standing   Upper Body Bathing: Minimal assistance;Sitting   Lower Body Bathing: Minimal assistance;Sit to/from stand   Upper Body Dressing : Minimal  assistance;Sitting   Lower Body Dressing: Minimal assistance;Sit to/from stand   Toilet Transfer: Minimal assistance;Ambulation;RW   Toileting- Clothing Manipulation and Hygiene: Minimal assistance;Sit to/from stand       Functional mobility during ADLs: Minimal assistance;Rolling walker       Vision Baseline Vision/History: Wears glasses Wears Glasses: At all times Patient Visual Report: No change from baseline       Perception     Praxis      Pertinent Vitals/Pain Pain Assessment: Faces Faces Pain Scale: No hurt     Hand Dominance Right   Extremity/Trunk Assessment Upper Extremity Assessment Upper Extremity Assessment: Overall WFL for tasks assessed   Lower Extremity Assessment Lower Extremity Assessment: Defer to PT evaluation       Communication Communication Communication: Expressive difficulties;Other (comment) (mumbles)   Cognition Arousal/Alertness: Awake/alert Behavior During Therapy: WFL for tasks assessed/performed Overall Cognitive Status: Impaired/Different from baseline Area of Impairment: Orientation;Attention;Memory;Following commands;Safety/judgement;Awareness;Problem solving                 Orientation Level: Disoriented to;Situation;Time Current Attention Level: Sustained Memory: Decreased short-term memory Following Commands: Follows one step commands inconsistently;Follows one step commands with increased time Safety/Judgement: Decreased awareness of safety;Decreased awareness of deficits Awareness: Intellectual Problem Solving: Slow processing;Decreased initiation;Difficulty sequencing;Requires verbal cues;Requires tactile cues General Comments: per family, pt functions better in the familiar environment of home   General Comments  72 bpm, 100% on  RA, 123/64    Exercises     Shoulder Instructions      Home Living Family/patient expects to be discharged to:: Private residence Living Arrangements: Alone Available Help at  Discharge: Family;Available 24 hours/day Type of Home: House Home Access: Stairs to enter [employer] of Steps: 1 Entrance Stairs-Rails: None Home Layout: Able to live on main level with bedroom/bathroom;Two level Alternate Level Stairs-Number of Steps: flight   Bathroom Shower/Tub: Hospital doctor Toilet: Handicapped height     Home Equipment: Environmental consultant - 2 wheels;Cane - single point;Shower seat;Grab bars - tub/shower;Bedside commode;Hand held shower head          Prior Functioning/Environment Level of Independence: Needs assistance  Gait / Transfers Assistance Needed: pt has a RW, refuses to use ADL's / Homemaking Assistance Needed: refuses to use shower seat, can prepare a simple meal, family or friend visit daily and help with heavy meal prep, housekeeping and med management   Comments: family also manages pt's bills        OT Problem List: Decreased strength;Decreased activity tolerance;Impaired balance (sitting and/or standing);Decreased cognition;Decreased safety awareness;Decreased knowledge of use of DME or AE      OT Treatment/Interventions: Self-care/ADL training;DME and/or AE instruction;Patient/family education;Balance training;Therapeutic activities    OT Goals(Current goals can be found in the care plan section) Acute Rehab OT Goals Patient Stated Goal: family wants to take pt home OT Goal Formulation: With patient Time For Goal Achievement: 12/28/19 Potential to Achieve Goals: Good ADL Goals Pt Will Perform Grooming: with supervision;standing Pt Will Perform Upper Body Dressing: with supervision;sitting Pt Will Perform Lower Body Dressing: with supervision;sit to/from stand Pt Will Transfer to Toilet: with supervision;ambulating;bedside commode (over toilet) Pt Will Perform Toileting - Clothing Manipulation and hygiene: with supervision;sit to/from stand Additional ADL Goal #1: Pt will follow one step commands within 5 seconds of request  within 75% accuracy.  OT Frequency: Min 2X/week   Barriers to D/C:            Co-evaluation              AM-PAC OT "6 Clicks" Daily Activity     Outcome Measure Help from another person eating meals?: A Little Help from another person taking care of personal grooming?: A Little Help from another person toileting, which includes using toliet, bedpan, or urinal?: A Little Help from another person bathing (including washing, rinsing, drying)?: A Little Help from another person to put on and taking off regular upper body clothing?: A Little Help from another person to put on and taking off regular lower body clothing?: A Little 6 Click Score: 18   End of Session Equipment Utilized During Treatment: Rolling walker;Gait belt  Activity Tolerance: Patient tolerated treatment well Patient left: in chair;with call bell/phone within reach;with chair alarm set;with family/visitor present  OT Visit Diagnosis: Unsteadiness on feet (R26.81);Other abnormalities of gait and mobility (R26.89);Muscle weakness (generalized) (M62.81);Other symptoms and signs involving cognitive function                Time: 6962-9528 OT Time Calculation (min): 35 min Charges:  OT General Charges $OT Visit: 1 Visit OT Evaluation $OT Eval Moderate Complexity: 1 Mod OT Treatments $Self Care/Home Management : 8-22 mins  Nestor Lewandowsky, OTR/L Acute Rehabilitation Services Pager: [phone] Office: [phone]  Malka So 12/14/2019, 11:13 AM

## 2019-12-14 NOTE — Progress Notes (Addendum)
CARDIAC REHAB PHASE I   PRE:  Rate/Rhythm: 72 SR  BP:  Supine:   Sitting: 114/96     SaO2: 96% RA  MODE:  Ambulation: 160 ft   POST:  Rate/Rhythm: 79 SR  BP:  Supine:   Sitting: 133/83    SaO2: 96% RA  Pt was seated in recliner upon arrival. Pt was independent getting out of chair. Pt ambulated 160 ft with RW x2 assist with gait belt. Pt got distracted very easily and needed help steering the RW several times. Pt tolerated walking well otherwise with no complaints. Pt did not recognize his son from outside of his room. Once we entered his room and he got closer he realized who it was. Returned pt seated in recliner with son by bedside. Chair alarm on with call bell in reach. [id number] Rick Duff, [place], CEP 12/14/2019 2:21 PM

## 2019-12-14 NOTE — Plan of Care (Signed)

## 2019-12-14 NOTE — Progress Notes (Signed)
Patient arrived at room 4E20 from [facility]. Report received, patient started on telemetry and CCMD notified. Vitals taken and documented. CHG completed. Will continue to monitor.   Donnelly Angelica, RN

## 2019-12-14 NOTE — Evaluation (Signed)
Physical Therapy Evaluation Patient Details Name: Tony Chapman MRN: [id number] DOB: [birth year] Today's Date: 12/14/2019   History of Present Illness  Pt is a 79 year old man admitted on 12/12/19 with STEMI. PMH: dementia, CAD, hypothyroidism, HTN, DVT.  Clinical Impression  Pt admitted with above diagnosis. Pt was able to ambulate with RW with min guard assist and mod cues for safety and min assist to ambulate without device.  Pt cognition impaired and it affects his ability to be more independent and decr his safety.  Family wants to take pt home and is going to line up 24 hour care as pt should not be left alone due to decr safety. Will follow acutely.  Pt currently with functional limitations due to the deficits listed below (see PT Problem List). Pt will benefit from skilled PT to increase their independence and safety with mobility to allow discharge to the venue listed below.      Follow Up Recommendations Home health PT;Supervision/Assistance - 24 hour    Equipment Recommendations  None recommended by PT    Recommendations for Other Services       Precautions / Restrictions Precautions Precautions: Fall Restrictions Weight Bearing Restrictions: No      Mobility  Bed Mobility Overal bed mobility: Needs Assistance Bed Mobility: Supine to Sit     Supine to sit: Min assist     General bed mobility comments: Pt in chair on PT arrival.     Transfers Overall transfer level: Needs assistance Equipment used: None Transfers: Sit to/from Stand Sit to Stand: Min assist         General transfer comment: cues for hand placement  Ambulation/Gait Ambulation/Gait assistance: Min guard;Min assist Gait Distance (Feet): 65 Feet Assistive device: Rolling walker (2 wheeled) Gait Pattern/deviations: Step-through pattern;Decreased stride length;Drifts right/left;Narrow base of support;Staggering left;Staggering right   Gait velocity interpretation: <1.31 ft/sec, indicative of  household ambulator General Gait Details: Pt began walk to door and stated he had to use the bathroom. Walked to bathroom and pt then said he couldnt go after sitting a few minutes.  Pt used RW initially and needs cues to use the RW correctly as he tends to leave it and not stay inside of it.  When leaving the bathroom, had pt walk without RW and pt struggled to maintain balance wihtout UE support and was staggering and needing incr assist.  Needs min guard assist, RW and cues for safety with mobility.   Stairs            Wheelchair Mobility    Modified Rankin (Stroke Patients Only)       Balance Overall balance assessment: Needs assistance Sitting-balance support: No upper extremity supported;Feet supported Sitting balance-Leahy Scale: Fair     Standing balance support: Bilateral upper extremity supported;During functional activity Standing balance-Leahy Scale: Poor Standing balance comment: Pt was able to stand with UE support with min gaurd assist and min assist without UE support.                              Pertinent Vitals/Pain Pain Assessment: No/denies pain Faces Pain Scale: No hurt    Home Living Family/patient expects to be discharged to:: Private residence Living Arrangements: Alone Available Help at Discharge: Family;Available 24 hours/day Type of Home: House Home Access: Stairs to enter Entrance Stairs-Rails: None Entrance Stairs-Number of Steps: 1 Home Layout: Able to live on main level with bedroom/bathroom;Two level Home Equipment: Gilford Rile -  2 wheels;Cane - single point;Shower seat;Grab bars - tub/shower;Bedside commode;Hand held shower head      Prior Function Level of Independence: Needs assistance   Gait / Transfers Assistance Needed: pt has a RW, refuses to use  ADL's / Homemaking Assistance Needed: refuses to use shower seat, can prepare a simple meal, family or friend visit daily and help with heavy meal prep, housekeeping and med  management  Comments: family also manages pt's bills     Hand Dominance   Dominant Hand: Right    Extremity/Trunk Assessment   Upper Extremity Assessment Upper Extremity Assessment: Defer to OT evaluation    Lower Extremity Assessment Lower Extremity Assessment: Generalized weakness       Communication   Communication: Expressive difficulties;Other (comment) (mumbles)  Cognition Arousal/Alertness: Awake/alert Behavior During Therapy: WFL for tasks assessed/performed Overall Cognitive Status: Impaired/Different from baseline Area of Impairment: Orientation;Attention;Memory;Following commands;Safety/judgement;Awareness;Problem solving                 Orientation Level: Disoriented to;Situation;Time Current Attention Level: Sustained Memory: Decreased short-term memory Following Commands: Follows one step commands inconsistently;Follows one step commands with increased time Safety/Judgement: Decreased awareness of safety;Decreased awareness of deficits Awareness: Intellectual Problem Solving: Slow processing;Decreased initiation;Difficulty sequencing;Requires verbal cues;Requires tactile cues General Comments: per family, pt functions better in the familiar environment of home      General Comments General comments (skin integrity, edema, etc.): 72 bpm, 100% on RA, 123/64    Exercises     Assessment/Plan    PT Assessment Patient needs continued PT services  PT Problem List Decreased activity tolerance;Decreased balance;Decreased mobility;Decreased knowledge of use of DME;Decreased safety awareness;Decreased knowledge of precautions;Decreased cognition       PT Treatment Interventions DME instruction;Gait training;Functional mobility training;Therapeutic activities;Therapeutic exercise;Balance training;Patient/family education    PT Goals (Current goals can be found in the Care Plan section)  Acute Rehab PT Goals Patient Stated Goal: family wants to take pt  home PT Goal Formulation: With patient Time For Goal Achievement: 12/28/19 Potential to Achieve Goals: Good    Frequency Min 3X/week   Barriers to discharge        Co-evaluation               AM-PAC PT "6 Clicks" Mobility  Outcome Measure Help needed turning from your back to your side while in a flat bed without using bedrails?: A Little Help needed moving from lying on your back to sitting on the side of a flat bed without using bedrails?: A Little Help needed moving to and from a bed to a chair (including a wheelchair)?: A Little Help needed standing up from a chair using your arms (e.g., wheelchair or bedside chair)?: A Little Help needed to walk in hospital room?: A Little Help needed climbing 3-5 steps with a railing? : A Little 6 Click Score: 18    End of Session Equipment Utilized During Treatment: Gait belt Activity Tolerance: Patient limited by fatigue Patient left: in chair;with call bell/phone within reach;with chair alarm set;with family/visitor present Nurse Communication: Mobility status PT Visit Diagnosis: Unsteadiness on feet (R26.81);Muscle weakness (generalized) (M62.81)    Time: 2671-2458 PT Time Calculation (min) (ACUTE ONLY): 14 min   Charges:   PT Evaluation $PT Eval Moderate Complexity: 1 Mod          Basilio Meadow W,PT Acute Rehabilitation Services Pager:  [phone]  Office:  [place] 12/14/2019, 1:12 PM

## 2019-12-15 ENCOUNTER — Other Ambulatory Visit (HOSPITAL_COMMUNITY): Payer: Self-pay | Admitting: Cardiology

## 2019-12-15 DIAGNOSIS — I2119 ST elevation (STEMI) myocardial infarction involving other coronary artery of inferior wall: Secondary | ICD-10-CM | POA: Diagnosis not present

## 2019-12-15 DIAGNOSIS — I10 Essential (primary) hypertension: Secondary | ICD-10-CM | POA: Diagnosis not present

## 2019-12-15 DIAGNOSIS — E785 Hyperlipidemia, unspecified: Secondary | ICD-10-CM

## 2019-12-15 DIAGNOSIS — F0390 Unspecified dementia without behavioral disturbance: Secondary | ICD-10-CM | POA: Diagnosis not present

## 2019-12-15 DIAGNOSIS — I441 Atrioventricular block, second degree: Secondary | ICD-10-CM | POA: Diagnosis not present

## 2019-12-15 LAB — BASIC METABOLIC PANEL
Anion gap: 8 (ref 5–15)
BUN: 13 mg/dL (ref 8–23)
CO2: 27 mmol/L (ref 22–32)
Calcium: 9.4 mg/dL (ref 8.9–10.3)
Chloride: 106 mmol/L (ref 98–111)
Creatinine, Ser: 0.99 mg/dL (ref 0.61–1.24)
GFR, Estimated: >60 mL/min (ref >60)
Glucose, Bld: 108 mg/dL — ABNORMAL HIGH (ref 70–99)
Potassium: 4 mmol/L (ref 3.5–5.1)
Sodium: 141 mmol/L (ref 135–145)

## 2019-12-15 MED ORDER — ATORVASTATIN CALCIUM 80 MG PO TABS
80.0000 mg | ORAL_TABLET | Freq: Every day | ORAL | 0 refills | Status: DC
Start: 2019-12-15 — End: 2020-04-07

## 2019-12-15 MED ORDER — NITROGLYCERIN 0.4 MG SL SUBL: 0.4000 mg | SUBLINGUAL_TABLET | SUBLINGUAL | 2 refills | Status: DC | PRN

## 2019-12-15 MED ORDER — TICAGRELOR 90 MG PO TABS: 90.0000 mg | ORAL_TABLET | Freq: Two times a day (BID) | ORAL | 2 refills | Status: AC

## 2019-12-15 MED ORDER — ISOSORBIDE MONONITRATE ER 30 MG PO TB24
30.0000 mg | ORAL_TABLET | Freq: Every day | ORAL | 0 refills | Status: DC
Start: 2019-12-15 — End: 2020-01-04

## 2019-12-15 MED FILL — ISOSORBIDE MN ER 30 MG TAB: 30 | 90 days supply | Qty: 90 | Fill #0

## 2019-12-15 MED FILL — NITROGLYCERIN 0.4 MG TAB SL: 0.4 | 7 days supply | Qty: 25 | Fill #0

## 2019-12-15 MED FILL — ATORVASTATIN CALCIUM 80 MG: 80 | 90 days supply | Qty: 90 | Fill #0

## 2019-12-15 MED FILL — BRILINTA 90 MG TABLET: 90 | 30 days supply | Qty: 60 | Fill #0

## 2019-12-15 NOTE — Progress Notes (Signed)
[id number] Education completed with family friend Carollee Massed who will be care giver. Reviewed NTG use, MI restrictions, walking for exercise with assistance and walker, importance of brilinta, and CRP 2. Referred to [facility] program to meet protocol but pt unable to attend. Gave heart healthy diet for reference. Understanding voiced.  Stressed that pt is not steady enough to be up by himself. Encouraged walker and assistance. Graylon Good RN BSN 12/15/2019 11:48 AM

## 2019-12-15 NOTE — Progress Notes (Signed)
Progress Note   Subjective   Sleeping comfortably this am.   Inpatient Medications    Scheduled Meds: . amLODipine  5 mg Oral Daily  . aspirin  81 mg Oral Daily  . atorvastatin  80 mg Oral Daily  . Chlorhexidine Gluconate Cloth  6 each Topical Daily  . heparin  4,000 Units Intravenous Once  . isosorbide mononitrate  30 mg Oral Daily  . levothyroxine  100 mcg Oral Q0600  . sodium chloride flush  3 mL Intravenous Q12H  . ticagrelor  90 mg Oral BID   Continuous Infusions: . sodium chloride 20 mL/hr at 12/12/19 0940  . sodium chloride     PRN Meds: sodium chloride, acetaminophen, ALPRAZolam, ondansetron (ZOFRAN) IV, sodium chloride flush   Vital Signs    Vitals:   12/14/19 2317 12/15/19 0041 12/15/19 0420 12/15/19 0729  BP:  131/83 122/84 122/68  Pulse:    62  Resp: 16 20 18 14   Temp: 98.1 F (36.7 C)  98.4 F (36.9 C)   TempSrc: Oral  Oral Oral  SpO2: 93%  100% 98%  Weight:      Height:        Intake/Output Summary (Last 24 hours) at 12/15/2019 0841 Last data filed at 12/15/2019 0553 Gross per 24 hour  Intake 360 ml  Output 700 ml  Net -340 ml   Filed Weights   12/12/19 0100 12/13/19 0310 12/14/19 0400  Weight: 80.1 kg 81.7 kg 80.6 kg    Telemetry    Sinus - AV block improved- Personally Reviewed  Physical Exam    GEN- The patient is elderly appearing, alert Head- normocephalic, atraumatic Eyes-  Sclera clear, conjunctiva pink Ears- hearing intact Oropharynx- clear Neck- supple, Lungs-  normal work of breathing Heart- Regular rate and rhythm  GI- soft  Extremities- no clubbing, cyanosis, or edema He does have a flank hematoma. No hip pain. Some back pain.    Labs    Chemistry Recent Labs  Lab 12/11/19 2315 12/11/19 2345 12/12/19 0327 12/14/19 0933 12/15/19 0237  NA 143   < > 141 141 141  K 3.4*   < > 4.0 3.0* 4.0  CL 107   < > 108 108 106  CO2 24   < > 25 25 27   GLUCOSE 129*   < > 117* 140* 108*  BUN 24*   < > 24* 9 13    CREATININE 1.67*   < > 1.37* 0.99 0.99  CALCIUM 10.0   < > 9.6 9.3 9.4  PROT 6.6  --   --   --   --   ALBUMIN 3.4*  --   --   --   --   AST 79*  --   --   --   --   ALT 51*  --   --   --   --   ALKPHOS 85  --   --   --   --   BILITOT 0.4  --   --   --   --   GFRNONAA 42*   < > 53* >60 >60  ANIONGAP 12   < > 8 8 8    < > = values in this interval not displayed.     Hematology Recent Labs  Lab 12/11/19 2345 12/11/19 2350 12/12/19 0327  WBC  --   --  11.9*  RBC  --   --  3.86*  HGB 11.9* 11.9* 11.8*  HCT 35.0* 35.0* 36.8*  MCV  --   --  95.3  MCH  --   --  30.6  MCHC  --   --  32.1  RDW  --   --  13.2  PLT  --   --  225     Patient ID  Tony Chapman is a 79 y.o. male with dementia, CAD, hypothyrodism who presents with STEMI but no active chest pain.  Assessment & Plan    1.  Inferior STEMI S/p cath 10/22 He has advanced CAD.  Balloon angioplasty was performed of the proximal RCA with poor result and occluded vessel distally. Vessel is severely calcified with diffuse disease.  Continue ASA and Brilinta Continue high intensity statin I have personally reviewed films. He is a poor candidate for further PCI from an anatomic standpoint and given his advanced dementia would favor a conservative approach. Will resume amlodipine 5 mg daily. Add Imdur 30 mg daily Avoid beta blocker due to AV block.  Improved activity yesterday. Appreciated PT/OT input  2. Second degree AV block Mobitz I improving Hopefully will resolve s/p inferior MI Hold beta blocker therapy  3. HTN Stable BP well controlled. ACEi held and renal function is now normal.  4. Dementia Agitation improved.  PT/OT appreciated. Will arrange home OT/PT/RN Daughter would like for him to go home if able. I spoke with her today and feel he is stable for DC home  5. Fall at home  Hematoma left flank Spine films without fracture.  Milliani Herrada [place] MD, [facility]   12/15/2019 8:41 AM

## 2019-12-15 NOTE — Progress Notes (Signed)
Discharge instructions provided to patient. Medication changes and follow-up appointments reviewed. All questions answered. IV's removed. Patient to be escorted home by daughter and friend.   Gailen Shelter RN

## 2019-12-15 NOTE — Progress Notes (Signed)
Physical Therapy Treatment Patient Details Name: Tony Chapman MRN: [id number] DOB: 11-Aug-1940 Today's Date: 12/15/2019    History of Present Illness Pt is a 79 year old man admitted on 12/12/19 with STEMI. PMH: dementia, CAD, hypothyroidism, HTN, DVT.    PT Comments    Pt very sleepy but easy to rouse for PT session.  Performed short bout of gt training.  Family reports he does not have a RW for use at home. Informed bedside RN and RNCM to obtain one for d/c today.  Pt resting in recliner with bed alarm on and patient in chair position.      Follow Up Recommendations  Home health PT;Supervision/Assistance - 24 hour     Equipment Recommendations  Rolling walker with 5" wheels    Recommendations for Other Services       Precautions / Restrictions Precautions Precautions: Fall Restrictions Weight Bearing Restrictions: No    Mobility  Bed Mobility Overal bed mobility: Needs Assistance Bed Mobility: Supine to Sit;Sit to Supine     Supine to sit: Mod assist Sit to supine: Min assist   General bed mobility comments: Increased assistance to move to edge of bed due to lethargic presentation.  Pt required min assistance to lift LEs to move back to bed.  Transfers Overall transfer level: Needs assistance Equipment used: Rolling walker (2 wheeled)   Sit to Stand: Min assist         General transfer comment: Cues for hand placement to and from seated surface attempts to pull and grab on RW for support.  Ambulation/Gait Ambulation/Gait assistance: Min assist Gait Distance (Feet): 80 Feet Assistive device: Rolling walker (2 wheeled) Gait Pattern/deviations: Step-through pattern;Decreased stride length;Drifts right/left;Narrow base of support;Staggering left;Staggering right     General Gait Details: Cues for RW and min assistance to right balance.   Stairs Stairs:  (Family present denies stairs and reports on small 2 inch threshold so stair training deferred.)            Wheelchair Mobility    Modified Rankin (Stroke Patients Only)       Balance Overall balance assessment: Needs assistance Sitting-balance support: No upper extremity supported;Feet supported Sitting balance-Leahy Scale: Fair       Standing balance-Leahy Scale: Poor Standing balance comment: Pt was able to stand with UE support with min gaurd assist and min assist without UE support.                             Cognition Arousal/Alertness: Lethargic;Suspect due to medications (RN reports he recieved ativan last night and has been sleeping all am.) Behavior During Therapy: WFL for tasks assessed/performed Overall Cognitive Status: History of cognitive impairments - at baseline                                 General Comments: cognitive impairments at baseline.      Exercises      General Comments        Pertinent Vitals/Pain Pain Assessment: No/denies pain Faces Pain Scale: No hurt    Home Living                      Prior Function            PT Goals (current goals can now be found in the care plan section) Acute Rehab PT Goals Patient Stated Goal:  family wants to take pt home Potential to Achieve Goals: Good Progress towards PT goals: Progressing toward goals    Frequency    Min 3X/week      PT Plan Discharge plan needs to be updated    Co-evaluation              AM-PAC PT "6 Clicks" Mobility   Outcome Measure  Help needed turning from your back to your side while in a flat bed without using bedrails?: A Little Help needed moving from lying on your back to sitting on the side of a flat bed without using bedrails?: A Little Help needed moving to and from a bed to a chair (including a wheelchair)?: A Little Help needed standing up from a chair using your arms (e.g., wheelchair or bedside chair)?: A Little Help needed to walk in hospital room?: A Little Help needed climbing 3-5 steps with a railing? : A  Little 6 Click Score: 18    End of Session Equipment Utilized During Treatment: Gait belt Activity Tolerance: Patient limited by fatigue Patient left: with call bell/phone within reach;with family/visitor present;in bed;with bed alarm set Nurse Communication: Mobility status PT Visit Diagnosis: Unsteadiness on feet (R26.81);Muscle weakness (generalized) (M62.81)     Time: 3893-7342 PT Time Calculation (min) (ACUTE ONLY): 23 min  Charges:  $Gait Training: 8-22 mins $Therapeutic Activity: 8-22 mins                     Erasmo Leventhal , PTA Acute Rehabilitation Services Pager [phone] Office [phone]     Soffia Doshier Eli Hose 12/15/2019, 12:26 PM

## 2019-12-15 NOTE — Discharge Summary (Signed)
Discharge Summary    Patient ID: Tony Chapman MRN: [id number]; DOB: 06-09-1940  Admit date: 12/11/2019 Discharge date: 12/15/2019  Primary Care Provider: Patient, No Pcp Per  Primary Cardiologist: Lauree Chandler, MD  Primary Electrophysiologist:  None   Discharge Diagnoses    Principal Problem:   Acute ST elevation myocardial infarction (STEMI) of inferior wall ([place]) Active Problems:   Hyperlipidemia   Second degree AV block, Mobitz type I   Dementia ([place])   Hypertension  Diagnostic Studies/Procedures    Cath: 12/11/19   Prox RCA to Mid RCA lesion is 60% stenosed.  Prox RCA lesion is 100% stenosed.  Mid RCA to Dist RCA lesion is 100% stenosed.  Ost Cx to Prox Cx lesion is 100% stenosed.  1st Mrg lesion is 100% stenosed.  Ost LAD to Mid LAD lesion is 40% stenosed.  2nd Diag lesion is 99% stenosed.  Mid LAD lesion is 40% stenosed.  Dist LAD lesion is 99% stenosed.  Balloon angioplasty was performed using a BALLOON SAPPHIRE 2.5X15.  Post intervention, there is a 80% residual stenosis.  Post intervention, there is a 60% residual stenosis.   1. The LAD is patent to the apex. Moderate calcific disease in the proximal and mid LAD. Severe apical LAD stenosis. The small caliber second diagonal branch has a severe mid stenosis.  2. Chronic occlusion of the ostial Circumflex.  3. Proximal occlusion of the RCA. The entire vessel is heavily calcified. There is a stent in the mid vessel. The distal lesion is felt to be a chronic total occlusion. The distal branches fill from left to right collaterals.   Recommendations: Will admit to the ICU. Will continue ASA, Brilinta and statin. Resume other home medications tomorrow after pharmacy medication review. I will continue Aggrastat for 18 hours. Will review films with the IC team but I do not think further PCI of the RCA is feasible given the chronic occlusion distally and heavy calcification of the entire vessel. He is  pain free with a stable blood pressure. Anticipate conservative management from this point given his advanced dementia.   Diagnostic Dominance: Right  Intervention    Echo: 12/12/19  IMPRESSIONS    1. Left ventricular ejection fraction, by estimation, is 50 to 55%. The  left ventricle has low normal function. The left ventricle demonstrates  regional wall motion abnormalities (see scoring diagram/findings for  description). Left ventricular diastolic  parameters are indeterminate. There is hypokinesis of the left  ventricular, basal inferior segment, inferoseptal wall and inferior wall.  2. Right ventricular systolic function is normal. The right ventricular  size is normal. There is normal pulmonary artery systolic pressure.  3. Left atrial size was mildly dilated.  4. The mitral valve is normal in structure. Trivial mitral valve  regurgitation.  5. The aortic valve is tricuspid. There is mild calcification of the  aortic valve. There is mild thickening of the aortic valve. Aortic valve  regurgitation is not visualized. Mild aortic valve sclerosis is present,  with no evidence of aortic valve  stenosis.  6. Aortic dilatation noted. There is mild dilatation of the ascending  aorta, measuring 39 mm.  7. The inferior vena cava is normal in size with greater than 50%  respiratory variability, suggesting right atrial pressure of 3 mmHg.   Comparison(s): No prior Echocardiogram.   Conclusion(s)/Recommendation(s): Hypokinesis of basal inferior and  inferoseptal wall with overall low normal EF. No significant valve  disease.  _____________   History of Present Illness  Tony Chapman is a 79 y.o. male with a history of dementia, CAD (unclear what arteries, done at [facility]), hypothyroidism, history of DVT who presented with STEMI. EMS reported that they were called after a fall. When they hooked him up for evaluation they noted ECG pattern consistent with inferior  stemi.   Unable to gather much history from the patient on admission given his dementia. However, he did state that he was not actively having chest pain and felt at his baseline. He did note that he would want procedures done to save his life. Dr. Angelena Form (interventional MD) discussed cath with HCPOA (son) and son agreed with procedure. Patient was taken from ED to cath lab in stable condition.    Hospital Course     1.  Inferior STEMI: underwent cardiac cath noted above with balloon angioplasty to the pRCA with poor result and occluded vessel distally. Vessel noted to be severely calcified with diffuse disease. Films were reviewed by Dr. Martinique and he is felt to be a poor candidate for further PCI and with advanced dementia will plan for medical therapy/conservative approach.  -- placed on ASA/Brilinta, statin increased to atorvastatin 80mg , restarted amlodipine 5mg  daily and added Imdur 30mg  daily. No BB with AVB.  -- seen by PT/OT with recommendations for home health at time of discharge  2. Second degree AV block Mobitz I: stable, hopefully will resolve s/p inferior MI. Continued surveillance  -- Hold beta blocker therapy  3. HTN: Stable -- BP well controlled. ACEi held and renal function improved.   4. Dementia: PT/OT appreciated. Will arrange home OT/PT/RN -- continue Aricept and Namenda   5. Fall at home: Hematoma left flank, but spine films without fracture.  Did the patient have an acute coronary syndrome (MI, NSTEMI, STEMI, etc) this admission?:  Yes                               AHA/ACC Clinical Performance & Quality Measures: 1. Aspirin prescribed? - Yes 2. ADP Receptor Inhibitor (Plavix/Clopidogrel, Brilinta/Ticagrelor or Effient/Prasugrel) prescribed (includes medically managed patients)? - Yes 3. Beta Blocker prescribed? - No - AVB 4. High Intensity Statin (Lipitor 40-80mg  or Crestor 20-40mg ) prescribed? - Yes 5. EF assessed during THIS hospitalization? -  Yes 6. For EF <40%, was ACEI/ARB prescribed? - Not Applicable (EF >/= 16%) 7. For EF <40%, Aldosterone Antagonist (Spironolactone or Eplerenone) prescribed? - Not Applicable (EF >/= 10%) 8. Cardiac Rehab Phase II ordered (including medically managed patients)? - Yes   _____________  Discharge Vitals Blood pressure 122/68, pulse 62, temperature 98.4 F (36.9 C), temperature source Oral, resp. rate 14, height 5\' 7"  (1.702 m), weight 80.6 kg, SpO2 98 %.  Filed Weights   12/12/19 0100 12/13/19 0310 12/14/19 0400  Weight: 80.1 kg 81.7 kg 80.6 kg    Labs & Radiologic Studies    CBC No results for input(s): WBC, NEUTROABS, HGB, HCT, MCV, PLT in the last 72 hours. Basic Metabolic Panel Recent Labs    12/14/19 0933 12/15/19 0237  NA 141 141  K 3.0* 4.0  CL 108 106  CO2 25 27  GLUCOSE 140* 108*  BUN 9 13  CREATININE 0.99 0.99  CALCIUM 9.3 9.4   Liver Function Tests No results for input(s): AST, ALT, ALKPHOS, BILITOT, PROT, ALBUMIN in the last 72 hours. No results for input(s): LIPASE, AMYLASE in the last 72 hours. High Sensitivity Troponin:   Recent Labs  Lab  12/11/19 2315 12/12/19 0327  TROPONINIHS 5,854* 9,063*    BNP Invalid input(s): POCBNP D-Dimer No results for input(s): DDIMER in the last 72 hours. Hemoglobin A1C No results for input(s): HGBA1C in the last 72 hours. Fasting Lipid Panel No results for input(s): CHOL, HDL, LDLCALC, TRIG, CHOLHDL, LDLDIRECT in the last 72 hours. Thyroid Function Tests No results for input(s): TSH, T4TOTAL, T3FREE, THYROIDAB in the last 72 hours.  Invalid input(s): FREET3 _____________  DG Lumbar Spine 2-3 Views  Result Date: 12/14/2019 CLINICAL DATA:  [place] at home EXAM: LUMBAR SPINE - 2-3 VIEW COMPARISON:  11/01/2006 FINDINGS: Five non-rib-bearing lumbar vertebra. Prior posterior fusion L4-S1. Disc space narrowing throughout lumbar spine with minimal retrolisthesis at L2-L3. Vertebral body heights maintained. No fracture,  subluxation, or bone destruction. Atherosclerotic calcifications aorta. Question BILATERAL renal calculi versus stool artifacts. IMPRESSION: Prior L4-S1 fusion. Degenerative disc disease changes L2-L3 with minimal retrolisthesis. No acute osseous abnormalities. Electronically Signed   By: Lavonia Dana M.D.   On: 12/14/2019 14:18   CARDIAC CATHETERIZATION  Addendum Date: 12/12/2019    Prox RCA to Mid RCA lesion is 60% stenosed.  Prox RCA lesion is 100% stenosed.  Mid RCA to Dist RCA lesion is 100% stenosed.  Ost Cx to Prox Cx lesion is 100% stenosed.  1st Mrg lesion is 100% stenosed.  Ost LAD to Mid LAD lesion is 40% stenosed.  2nd Diag lesion is 99% stenosed.  Mid LAD lesion is 40% stenosed.  Dist LAD lesion is 99% stenosed.  Balloon angioplasty was performed using a BALLOON SAPPHIRE 2.5X15.  Post intervention, there is a 80% residual stenosis.  Post intervention, there is a 60% residual stenosis.  1. The LAD is patent to the apex. Moderate calcific disease in the proximal and mid LAD. Severe apical LAD stenosis. The small caliber second diagonal branch has a severe mid stenosis. 2. Chronic occlusion of the ostial Circumflex. 3. Proximal occlusion of the RCA. The entire vessel is heavily calcified. There is a stent in the mid vessel. The distal lesion is felt to be a chronic total occlusion. The distal branches fill from left to right collaterals. Recommendations: Will admit to the ICU. Will continue ASA, Brilinta and statin. Resume other home medications tomorrow after pharmacy medication review. I will continue Aggrastat for 18 hours. Will review films with the IC team but I do not think further PCI of the RCA is feasible given the chronic occlusion distally and heavy calcification of the entire vessel. He is pain free with a stable blood pressure. Anticipate conservative management from this point given his advanced dementia.   Result Date: 12/12/2019  Prox RCA to Mid RCA lesion is 60%  stenosed.  Prox RCA lesion is 100% stenosed.  Mid RCA to Dist RCA lesion is 100% stenosed.  Ost Cx to Prox Cx lesion is 100% stenosed.  1st Mrg lesion is 100% stenosed.  Ost LAD to Mid LAD lesion is 40% stenosed.  2nd Diag lesion is 99% stenosed.  Mid LAD lesion is 40% stenosed.  Dist LAD lesion is 99% stenosed.  1. The LAD is patent to the apex. Moderate calcific disease in the proximal and mid LAD. Severe apical LAD stenosis. The small caliber second diagonal branch has a severe mid stenosis. 2. Chronic occlusion of the ostial Circumflex. 3. Proximal occlusion of the RCA. The entire vessel is heavily calcified. There is a stent in the mid vessel. The distal lesion is felt to be a chronic total occlusion. The distal branches fill  from left to right collaterals. Recommendations: Will admit to the ICU. Will continue ASA, Brilinta and statin. Resume other home medications tomorrow after pharmacy medication review. I will continue Aggrastat for 18 hours. Will review films with the IC team but I do not think further PCI of the RCA is feasible given the chronic occlusion distally and heavy calcification of the entire vessel. He is pain free with a stable blood pressure. Anticipate conservative management from this point given his advanced dementia.   ECHOCARDIOGRAM COMPLETE  Result Date: 12/12/2019    ECHOCARDIOGRAM REPORT   Patient Name:   KIERON KANTNER [facility] Date of Exam: 12/12/2019 Medical Rec #:  [id number]      Height:       67.0 in Accession #:    [id number]     Weight:       176.6 lb Date of Birth:  1941-02-06       BSA:          1.918 m Patient Age:    63 years       BP:           118/62 mmHg Patient Gender: M              HR:           44 bpm. Exam Location:  Inpatient Procedure: 2D Echo Indications:    cad of native vessel 414.01  History:        Patient has no prior history of Echocardiogram examinations.                 Abnormal ECG.  Sonographer:    Johny Chess Referring Phys: Show Low  1. Left ventricular ejection fraction, by estimation, is 50 to 55%. The left ventricle has low normal function. The left ventricle demonstrates regional wall motion abnormalities (see scoring diagram/findings for description). Left ventricular diastolic  parameters are indeterminate. There is hypokinesis of the left ventricular, basal inferior segment, inferoseptal wall and inferior wall.  2. Right ventricular systolic function is normal. The right ventricular size is normal. There is normal pulmonary artery systolic pressure.  3. Left atrial size was mildly dilated.  4. The mitral valve is normal in structure. Trivial mitral valve regurgitation.  5. The aortic valve is tricuspid. There is mild calcification of the aortic valve. There is mild thickening of the aortic valve. Aortic valve regurgitation is not visualized. Mild aortic valve sclerosis is present, with no evidence of aortic valve stenosis.  6. Aortic dilatation noted. There is mild dilatation of the ascending aorta, measuring 39 mm.  7. The inferior vena cava is normal in size with greater than 50% respiratory variability, suggesting right atrial pressure of 3 mmHg. Comparison(s): No prior Echocardiogram. Conclusion(s)/Recommendation(s): Hypokinesis of basal inferior and inferoseptal wall with overall low normal EF. No significant valve disease. FINDINGS  Left Ventricle: Left ventricular ejection fraction, by estimation, is 50 to 55%. The left ventricle has low normal function. The left ventricle demonstrates regional wall motion abnormalities. The left ventricular internal cavity size was normal in size. There is no left ventricular hypertrophy. Left ventricular diastolic parameters are indeterminate. Right Ventricle: The right ventricular size is normal. Right vetricular wall thickness was not well visualized. Right ventricular systolic function is normal. There is normal pulmonary artery systolic pressure. The tricuspid  regurgitant velocity is 2.77 m/s, and with an assumed right atrial pressure of 3 mmHg, the estimated right ventricular systolic pressure is 99.3 mmHg. Left Atrium: Left atrial  size was mildly dilated. Right Atrium: Right atrial size was normal in size. Pericardium: Trivial pericardial effusion is present. Mitral Valve: The mitral valve is normal in structure. Trivial mitral valve regurgitation. Tricuspid Valve: The tricuspid valve is normal in structure. Tricuspid valve regurgitation is mild. Aortic Valve: The aortic valve is tricuspid. There is mild calcification of the aortic valve. There is mild thickening of the aortic valve. There is mild to moderate aortic valve annular calcification. Aortic valve regurgitation is not visualized. Mild aortic valve sclerosis is present, with no evidence of aortic valve stenosis. Pulmonic Valve: The pulmonic valve was not well visualized. Pulmonic valve regurgitation is not visualized. Aorta: Aortic dilatation noted. There is mild dilatation of the ascending aorta, measuring 39 mm. Venous: The inferior vena cava is normal in size with greater than 50% respiratory variability, suggesting right atrial pressure of 3 mmHg. IAS/Shunts: The atrial septum is grossly normal.  LEFT VENTRICLE PLAX 2D LVIDd:         4.90 cm  Diastology LVIDs:         4.00 cm  LV e' medial:    7.51 cm/s LV PW:         0.90 cm  LV E/e' medial:  12.3 LV IVS:        0.90 cm  LV e' lateral:   8.16 cm/s LVOT diam:     2.10 cm  LV E/e' lateral: 11.3 LV SV:         70 LV SV Index:   37 LVOT Area:     3.46 cm  RIGHT VENTRICLE         IVC TAPSE (M-mode): 1.9 cm  IVC diam: 1.80 cm LEFT ATRIUM             Index       RIGHT ATRIUM           Index LA diam:        3.60 cm 1.88 cm/m  RA Area:     15.90 cm LA Vol (A2C):   60.0 ml 31.28 ml/m RA Volume:   40.30 ml  21.01 ml/m LA Vol (A4C):   54.9 ml 28.62 ml/m LA Biplane Vol: 59.5 ml 31.02 ml/m  AORTIC VALVE LVOT Vmax:   90.70 cm/s LVOT Vmean:  57.800 cm/s LVOT VTI:     0.203 m  AORTA Ao Root diam: 3.20 cm Ao Asc diam:  3.90 cm MITRAL VALVE               TRICUSPID VALVE MV Area (PHT): 3.99 cm    TR Peak grad:   30.7 mmHg MV Decel Time: 190 msec    TR Vmax:        277.00 cm/s MV E velocity: 92.50 cm/s MV A velocity: 78.80 cm/s  SHUNTS MV E/A ratio:  1.17        Systemic VTI:  0.20 m                            Systemic Diam: 2.10 cm Buford Dresser MD Electronically signed by Buford Dresser MD Signature Date/Time: 12/12/2019/3:36:05 PM    Final    Disposition   Pt is being discharged home today in good condition.  Follow-up Plans & Appointments     Follow-up Information    Lawtey, Crista Luria, [place] Follow up on 12/30/2019.   Specialty: Cardiology Why: at 3:15pm for your follow up appt Contact information: [address]  [place] [address] [phone]              Discharge Instructions    Diet - low sodium heart healthy   Complete by: As directed    Discharge instructions   Complete by: As directed    PLEASE DO NOT [place]!!!!! Also keep a log of you blood pressures and bring back to your follow up appt. Please call the office with any questions.   Patients taking blood thinners should generally stay away from medicines like ibuprofen, Advil, Motrin, naproxen, and Aleve due to risk of stomach bleeding. You may take Tylenol as directed or talk to your primary doctor about alternatives.   Face-to-face encounter (required for Medicare/Medicaid patients)   Complete by: As directed    I Reino Bellis certify that this patient is under my care and that I, or a nurse practitioner or physician's assistant working with me, had a face-to-face encounter that meets the physician face-to-face encounter requirements with this patient on 12/15/2019. The encounter with the patient was in whole, or in part for the following medical condition(s) which is the primary reason for home health care (List medical  condition): CAD with STEMI, deconditioned   The encounter with the patient was in whole, or in part, for the following medical condition, which is the primary reason for home health care: CAD, deconditioned   I certify that, based on my findings, the following services are medically necessary home health services: Physical therapy   Reason for Medically Necessary Home Health Services: Other See Comments   My clinical findings support the need for the above services: OTHER SEE COMMENTS   Further, I certify that my clinical findings support that this patient is homebound due to: Ambulates short distances less than 300 feet   Home Health   Complete by: As directed    To provide the following care/treatments:  PT OT RN     Increase activity slowly   Complete by: As directed    No dressing needed   Complete by: As directed      Discharge Medications   Allergies as of 12/15/2019   No Known Allergies     Medication List    STOP taking these medications   benazepril 20 MG tablet Commonly known as: LOTENSIN     TAKE these medications   amLODipine 10 MG tablet Commonly known as: NORVASC Take 10 mg by mouth daily.   aspirin EC 81 MG tablet Take 81 mg by mouth daily. Swallow whole.   atorvastatin 80 MG tablet Commonly known as: LIPITOR Take 1 tablet (80 mg total) by mouth daily. What changed:   medication strength  how much to take   donepezil 10 MG tablet Commonly known as: ARICEPT Take 10 mg by mouth at bedtime.   furosemide 40 MG tablet Commonly known as: LASIX Take 40 mg by mouth See admin instructions. Take two times per week by mouth   isosorbide mononitrate 30 MG 24 hr tablet Commonly known as: IMDUR Take 1 tablet (30 mg total) by mouth daily.   levothyroxine 100 MCG tablet Commonly known as: SYNTHROID Take 100 mcg by mouth daily before breakfast.   memantine 5 MG tablet Commonly known as: NAMENDA Take 5 mg by mouth daily.   nitroGLYCERIN 0.4 MG SL  tablet Commonly known as: Nitrostat Place 1 tablet (0.4 mg total) under the tongue every 5 (five) minutes as needed.   potassium chloride 10 MEQ tablet Commonly known as:  KLOR-CON Take 10 mEq by mouth See admin instructions. Take two times per week by mouth   ticagrelor 90 MG Tabs tablet Commonly known as: BRILINTA Take 1 tablet (90 mg total) by mouth 2 (two) times daily.            Discharge Care Instructions  (From admission, onward)         Start     Ordered   12/15/19 0000  No dressing needed        12/15/19 1058          Outstanding Labs/Studies   FLP/LFTs in 8 weeks  Duration of Discharge Encounter   Greater than 30 minutes including physician time.  Signed, Reino Bellis, NP 12/15/2019, 10:59 AM

## 2019-12-15 NOTE — TOC Transition Note (Addendum)
Transition of Care [facility]) - CM/SW Discharge Note   Patient Details  Name: Tony Chapman MRN: [id number] Date of Birth: Aug 27, 1940  [facility] of [facility]) CM/SW Contact:  Carles Collet, RN Phone Number: 12/15/2019, 1:49 PM   Clinical Narrative:   Damaris Schooner w patient's friend at bedside, she deferred to patient's daughter. Spoke w daughter in law over the phone. She was agreeable to [facility] services. Verified demographics.  Patient will have supervision from friend at time of DC.  Discussed [facility] ratings agreeable to referral, accepted. Emailed Rx for RW to daughter at [email or web address] at her request. No need for DME at time of DC. Emailed resources for private duty care as well. Meds filled by [facility] pharmacy.   [name], [name] Daughter   [phone]         Final next level of care: Home w Home Health Services Barriers to Discharge: No Barriers Identified   Patient Goals and CMS Choice Patient states their goals for this hospitalization and ongoing recovery are:: to go home CMS Medicare.gov Compare Post Acute Care list provided to:: Patient Represenative (must comment) Choice offered to / list presented to : Adult Children  Discharge Placement                       Discharge Plan and Services                          HH Arranged: PT, OT, RN [facility] Agency: [place] Date [facility] Agency Contacted: 12/15/19 Time HH Agency Contacted: 1200 Representative spoke with at [place]: [address]  Social Determinants of Health ([place]) Interventions Food Insecurity Interventions: Intervention Not Indicated Intimate Partner Violence Interventions: Intervention Not Indicated   Readmission Risk Interventions No flowsheet data found.

## 2019-12-15 NOTE — TOC Benefit Eligibility Note (Signed)
Transition of Care [facility]) Benefit Eligibility Note    Patient Details  Name: CATLIN AYCOCK MRN: [id number] Date of Birth: 07-Sep-1940   Medication/Dose: Kary Kos 90mg .bid  Covered?: Yes  Tier: 3 Drug  Prescription Coverage [facility]erred Pharmacy: [facility]  Spoke with Person/Company/Phone Number:: Bevely Palmer [employer]. @[phone]  Co-Pay: $47.00  Prior Approval: No  Deductible: Unmet       Shelda Altes Phone Number: 12/15/2019, 9:11 AM

## 2019-12-22 DIAGNOSIS — I2119 ST elevation (STEMI) myocardial infarction involving other coronary artery of inferior wall: Secondary | ICD-10-CM | POA: Diagnosis not present

## 2019-12-22 DIAGNOSIS — I441 Atrioventricular block, second degree: Secondary | ICD-10-CM | POA: Diagnosis not present

## 2019-12-22 DIAGNOSIS — I10 Essential (primary) hypertension: Secondary | ICD-10-CM | POA: Diagnosis not present

## 2019-12-22 DIAGNOSIS — I2510 Atherosclerotic heart disease of native coronary artery without angina pectoris: Secondary | ICD-10-CM | POA: Diagnosis not present

## 2019-12-23 DIAGNOSIS — I441 Atrioventricular block, second degree: Secondary | ICD-10-CM | POA: Diagnosis not present

## 2019-12-23 DIAGNOSIS — I10 Essential (primary) hypertension: Secondary | ICD-10-CM | POA: Diagnosis not present

## 2019-12-23 DIAGNOSIS — I2510 Atherosclerotic heart disease of native coronary artery without angina pectoris: Secondary | ICD-10-CM | POA: Diagnosis not present

## 2019-12-23 DIAGNOSIS — I2119 ST elevation (STEMI) myocardial infarction involving other coronary artery of inferior wall: Secondary | ICD-10-CM | POA: Diagnosis not present

## 2019-12-25 DIAGNOSIS — I2119 ST elevation (STEMI) myocardial infarction involving other coronary artery of inferior wall: Secondary | ICD-10-CM | POA: Diagnosis not present

## 2019-12-25 DIAGNOSIS — I10 Essential (primary) hypertension: Secondary | ICD-10-CM | POA: Diagnosis not present

## 2019-12-25 DIAGNOSIS — I2510 Atherosclerotic heart disease of native coronary artery without angina pectoris: Secondary | ICD-10-CM | POA: Diagnosis not present

## 2019-12-25 DIAGNOSIS — I441 Atrioventricular block, second degree: Secondary | ICD-10-CM | POA: Diagnosis not present

## 2019-12-28 DIAGNOSIS — I10 Essential (primary) hypertension: Secondary | ICD-10-CM | POA: Diagnosis not present

## 2019-12-28 DIAGNOSIS — I441 Atrioventricular block, second degree: Secondary | ICD-10-CM | POA: Diagnosis not present

## 2019-12-28 DIAGNOSIS — I2510 Atherosclerotic heart disease of native coronary artery without angina pectoris: Secondary | ICD-10-CM | POA: Diagnosis not present

## 2019-12-28 DIAGNOSIS — I2119 ST elevation (STEMI) myocardial infarction involving other coronary artery of inferior wall: Secondary | ICD-10-CM | POA: Diagnosis not present

## 2019-12-29 DIAGNOSIS — I2510 Atherosclerotic heart disease of native coronary artery without angina pectoris: Secondary | ICD-10-CM | POA: Diagnosis not present

## 2019-12-29 DIAGNOSIS — I2119 ST elevation (STEMI) myocardial infarction involving other coronary artery of inferior wall: Secondary | ICD-10-CM | POA: Diagnosis not present

## 2019-12-29 DIAGNOSIS — I4891 Unspecified atrial fibrillation: Secondary | ICD-10-CM | POA: Diagnosis not present

## 2019-12-29 DIAGNOSIS — I441 Atrioventricular block, second degree: Secondary | ICD-10-CM | POA: Diagnosis not present

## 2019-12-29 DIAGNOSIS — I25119 Atherosclerotic heart disease of native coronary artery with unspecified angina pectoris: Secondary | ICD-10-CM | POA: Diagnosis not present

## 2019-12-29 DIAGNOSIS — Z299 Encounter for prophylactic measures, unspecified: Secondary | ICD-10-CM | POA: Diagnosis not present

## 2019-12-29 DIAGNOSIS — I10 Essential (primary) hypertension: Secondary | ICD-10-CM | POA: Diagnosis not present

## 2019-12-30 ENCOUNTER — Encounter: Payer: Self-pay | Admitting: Physician Assistant

## 2019-12-30 ENCOUNTER — Ambulatory Visit: Payer: Medicare Other | Admitting: Physician Assistant

## 2019-12-30 ENCOUNTER — Other Ambulatory Visit: Payer: Self-pay

## 2019-12-30 VITALS — BP 122/68 | HR 63 | Ht 67.0 in | Wt 180.0 lb

## 2019-12-30 DIAGNOSIS — E782 Mixed hyperlipidemia: Secondary | ICD-10-CM

## 2019-12-30 DIAGNOSIS — I441 Atrioventricular block, second degree: Secondary | ICD-10-CM

## 2019-12-30 DIAGNOSIS — I2119 ST elevation (STEMI) myocardial infarction involving other coronary artery of inferior wall: Secondary | ICD-10-CM | POA: Diagnosis not present

## 2019-12-30 DIAGNOSIS — I10 Essential (primary) hypertension: Secondary | ICD-10-CM | POA: Diagnosis not present

## 2019-12-30 DIAGNOSIS — I2510 Atherosclerotic heart disease of native coronary artery without angina pectoris: Secondary | ICD-10-CM | POA: Diagnosis not present

## 2019-12-30 NOTE — Progress Notes (Signed)
Cardiology Office Note:    Date:  12/30/2019   ID:  SHONTEZ SERMON, DOB 10-17-1940, MRN [id number]  PCP:  Patient, No Pcp Per  [facility] Cardiologist:  Lauree Chandler, MD  [facility] Electrophysiologist:  None   Chief Complaint: hospital follow up   History of Present Illness:    Tony Chapman is a 79 y.o. male with a hx of history of CAD s/p PCI to unknown artery at [facility], dementia, hypothyroidism and history of DVT recently admitted for inferior STEMI. Underwent cardiac cath noted below with balloon angioplasty to the pRCA with poor result and occluded vessel distally. Vessel noted to be severely calcified with diffuse disease. Films were reviewed by Dr. Martinique and he felt to be a poor candidate for further PCI and with advanced dementia. Plan for medical therapy/conservative approach. Placed on ASA/Brilinta, statin increased to atorvastatin 80mg , restarted amlodipine 5mg  daily and added Imdur 30mg  daily. No BB due to 2nd degree AVB, Mobitz I, felt will resolve s/p inferior MI.   Here for follow up with his son who is power of attorney.  Doing well with physical therapy without chest tightness or shortness of breath.  Compliant with medication.  History obtained from son.  No orthopnea, PND, syncope, lower extremity edema or melena. Patient's main complain is back pain.   Past Medical History:  Diagnosis Date  . CAD (coronary artery disease)   . Dementia ([place])   . DVT (deep venous thrombosis) ([place])   . HLD (hyperlipidemia)   . HTN (hypertension)   . Hypothyroid   . Lumbar spondylosis   . Prostate cancer [facility])     Past Surgical History:  Procedure Laterality Date  . BACK SURGERY    . CORONARY/GRAFT ACUTE MI REVASCULARIZATION N/A 12/11/2019   Procedure: Coronary/Graft Acute MI Revascularization;  Surgeon: Burnell Blanks, MD;  Location: [place] [facility];  Service: Cardiovascular;  Laterality: N/A;  . LEFT HEART CATH AND CORONARY  ANGIOGRAPHY N/A 12/11/2019   Procedure: LEFT HEART CATH AND CORONARY ANGIOGRAPHY;  Surgeon: Burnell Blanks, MD;  Location: [place] [facility];  Service: Cardiovascular;  Laterality: N/A;    Current Medications: Prior to Admission medications   Medication Sig Start Date End Date Taking? Authorizing Provider  amLODipine (NORVASC) 10 MG tablet Take 10 mg by mouth daily.   Yes [provider]  aspirin EC 81 MG tablet Take 81 mg by mouth daily. Swallow whole.   Yes [provider]  atorvastatin (LIPITOR) 80 MG tablet Take 1 tablet (80 mg total) by mouth daily. 12/15/19  Yes Reino Bellis B, NP  benazepril (LOTENSIN) 20 MG tablet Take 20 mg by mouth daily.   Yes [provider]  diphenhydrAMINE (BENADRYL) 25 MG tablet Take 25 mg by mouth every 6 (six) hours as needed for sleep.   Yes [provider]  donepezil (ARICEPT) 10 MG tablet Take 10 mg by mouth at bedtime.   Yes [provider]  furosemide (LASIX) 40 MG tablet Take 40 mg by mouth See admin instructions. Take two times per week by mouth   Yes [provider]  isosorbide mononitrate (IMDUR) 30 MG 24 hr tablet Take 1 tablet (30 mg total) by mouth daily. 12/15/19  Yes Cheryln Manly, NP  levothyroxine (SYNTHROID) 100 MCG tablet Take 100 mcg by mouth daily before breakfast.   Yes [provider]  memantine (NAMENDA) 5 MG tablet Take 5 mg by mouth daily.   Yes [provider]  nitroGLYCERIN (NITROSTAT) 0.4 MG SL tablet Place 1 tablet (0.4 mg total) under the tongue every 5 (five) minutes as needed. 12/15/19  Yes Reino Bellis B, NP  potassium chloride (KLOR-CON) 10 MEQ tablet Take 10 mEq by mouth See admin instructions. Take two times per week by mouth   Yes [provider]  ticagrelor (BRILINTA) 90 MG TABS tablet Take 1 tablet (90 mg total) by mouth 2 (two) times daily. 12/15/19  Yes Cheryln Manly, NP    Allergies:   Patient has no known  allergies.   Social History   Socioeconomic History  . Marital status: Divorced    Spouse name: Not on file  . Number of children: Not on file  . Years of education: Not on file  . Highest education level: Not on file  Occupational History  . Occupation: pastor  Tobacco Use  . Smoking status: Never Smoker  . Smokeless tobacco: Never Used  Vaping Use  . Vaping Use: Never used  Substance and Sexual Activity  . Alcohol use: Not Currently  . Drug use: Never  . Sexual activity: Not Currently  Other Topics Concern  . Not on file  Social History Narrative  . Not on file   Social Determinants of Health   Financial Resource Strain: Low Risk   . Difficulty of Paying Living Expenses: Not very hard  Food Insecurity: No Food Insecurity  . Worried About Charity fundraiser in the Last Year: Never true  . Ran Out of Food in the Last Year: Never true  Transportation Needs: No Transportation Needs  . Lack of Transportation (Medical): No  . Lack of Transportation (Non-Medical): No  Physical Activity: Insufficiently Active  . Days of Exercise per Week: 7 days  . Minutes of Exercise per Session: 10 min  Stress: No Stress Concern Present  . Feeling of Stress : Only a little  Social Connections: Moderately Integrated  . Frequency of Communication with Friends and Family: More than three times a week  . Frequency of Social Gatherings with Friends and Family: More than three times a week  . Attends Religious Services: More than 4 times per year  . Active Member of Clubs or Organizations: No  . Attends Archivist Meetings: Never  . Marital Status: Married     Family History: The patient's family history is not on file.    ROS:   Please see the history of present illness.    All other systems reviewed and are negative.   EKGs/Labs/Other Studies Reviewed:    The following studies were reviewed today: Cath: 12/11/19   Prox RCA to Mid RCA lesion is 60% stenosed.  Prox  RCA lesion is 100% stenosed.  Mid RCA to Dist RCA lesion is 100% stenosed.  Ost Cx to Prox Cx lesion is 100% stenosed.  1st Mrg lesion is 100% stenosed.  Ost LAD to Mid LAD lesion is 40% stenosed.  2nd Diag lesion is 99% stenosed.  Mid LAD lesion is 40% stenosed.  Dist LAD lesion is 99% stenosed.  Balloon angioplasty was performed using a BALLOON SAPPHIRE 2.5X15.  Post intervention, there is a 80% residual stenosis.  Post intervention, there is a 60% residual stenosis.  1. The LAD is patent to the apex. Moderate calcific disease in the proximal and mid LAD. Severe apical LAD stenosis. The small caliber second diagonal branch has a severe mid stenosis.  2. Chronic occlusion of the ostial Circumflex.  3. Proximal occlusion of  the RCA. The entire vessel is heavily calcified. There is a stent in the mid vessel. The distal lesion is felt to be a chronic total occlusion. The distal branches fill from left to right collaterals.   Recommendations: Will admit to the ICU. Will continue ASA, Brilinta and statin. Resume other home medications tomorrow after pharmacy medication review. I will continue Aggrastat for 18 hours. Will review films with the IC team but I do not think further PCI of the RCA is feasible given the chronic occlusion distally and heavy calcification of the entire vessel. He is pain free with a stable blood pressure. Anticipate conservative management from this point given his advanced dementia.   Diagnostic Dominance: Right  Intervention    Echo: 12/12/19  IMPRESSIONS    1. Left ventricular ejection fraction, by estimation, is 50 to 55%. The  left ventricle has low normal function. The left ventricle demonstrates  regional wall motion abnormalities (see scoring diagram/findings for  description). Left ventricular diastolic  parameters are indeterminate. There is hypokinesis of the left  ventricular, basal inferior segment, inferoseptal wall and inferior  wall.  2. Right ventricular systolic function is normal. The right ventricular  size is normal. There is normal pulmonary artery systolic pressure.  3. Left atrial size was mildly dilated.  4. The mitral valve is normal in structure. Trivial mitral valve  regurgitation.  5. The aortic valve is tricuspid. There is mild calcification of the  aortic valve. There is mild thickening of the aortic valve. Aortic valve  regurgitation is not visualized. Mild aortic valve sclerosis is present,  with no evidence of aortic valve  stenosis.  6. Aortic dilatation noted. There is mild dilatation of the ascending  aorta, measuring 39 mm.  7. The inferior vena cava is normal in size with greater than 50%  respiratory variability, suggesting right atrial pressure of 3 mmHg.   Comparison(s): No prior Echocardiogram.   Conclusion(s)/Recommendation(s): Hypokinesis of basal inferior and  inferoseptal wall with overall low normal EF. No significant valve  disease.   EKG:  EKG is  ordered today.  The ekg ordered today demonstrates NSR, TWI inferiorly   Recent Labs: 12/11/2019: ALT 51 12/12/2019: Hemoglobin 11.8; Platelets 225 12/15/2019: BUN 13; Creatinine, Ser 0.99; Potassium 4.0; Sodium 141  Recent Lipid Panel    Component Value Date/Time   CHOL 161 12/11/2019 2315   TRIG 139 12/11/2019 2315   HDL 49 12/11/2019 2315   CHOLHDL 3.3 12/11/2019 2315   VLDL 28 12/11/2019 2315   LDLCALC 84 12/11/2019 2315     Risk Assessment/Calculations:       Physical Exam:    VS:  BP 122/68   Pulse 63   Ht 5\' 7"  (1.702 m)   Wt 180 lb (81.6 kg)   SpO2 99%   BMI 28.19 kg/m     Wt Readings from Last 3 Encounters:  12/30/19 180 lb (81.6 kg)  12/14/19 177 lb 11.1 oz (80.6 kg)  01/07/12 200 lb (90.7 kg)     GEN:  Well nourished, well developed in no acute distress HEENT: Normal NECK: No JVD; No carotid bruits LYMPHATICS: No lymphadenopathy CARDIAC: RRR, no murmurs, rubs, gallops RESPIRATORY:   Clear to auscultation without rales, wheezing or rhonchi  ABDOMEN: Soft, non-tender, non-distended MUSCULOSKELETAL:  No edema; No deformity  SKIN: Warm and dry NEUROLOGIC:  Alert and oriented x 3 PSYCHIATRIC:  Dementia   ASSESSMENT AND PLAN:    1. CAD/ recent inferior STEMI s/p angioplasty to pRCA - Did  not felt candidate for further intervention 2nd to advanced dementia - Continue ASA and Brilinta - Continue statin, Imdur and amlodipine - No BB with AVB -No angina.  Continue home PT  2. HTN -Stable and well-controlled on current medication.  No change.  3. Second degree AV block Mobitz I: - Avoid rate control agent.  No bradycardia on EKG today.  No syncope or dizziness.  4.  Hyperlipidemia - 12/11/2019: Cholesterol 161; HDL 49; LDL Cholesterol 84; Triglycerides 139; VLDL 28  -Continue high intensity statin -Repeat labs at follow-up.     Medication Adjustments/Labs and Tests Ordered: Current medicines are reviewed at length with the patient today.  Concerns regarding medicines are outlined above.  Orders Placed This Encounter  Procedures  . EKG 12-Lead   No orders of the defined types were placed in this encounter.   Patient Instructions  Medication Instructions:  Your physician recommends that you continue on your current medications as directed. Please refer to the Current Medication list given to you today.  *If you need a refill on your cardiac medications before your next appointment, please call your pharmacy*   Lab Work: None ordered  If you have labs (blood work) drawn today and your tests are completely normal, you will receive your results only by: Marland Kitchen MyChart Message (if you have MyChart) OR . A paper copy in the mail If you have any lab test that is abnormal or we need to change your treatment, we will call you to review the results.   Testing/Procedures: None ordered   Follow-Up: At [facility], you and your health needs are our priority.   As part of our continuing mission to provide you with exceptional heart care, we have created designated Provider Care Teams.  These Care Teams include your primary Cardiologist (physician) and Advanced Practice Providers (APPs -  Physician Assistants and Nurse Practitioners) who all work together to provide you with the care you need, when you need it.  We recommend signing up for the patient portal called "MyChart".  Sign up information is provided on this After Visit Summary.  MyChart is used to connect with patients for Virtual Visits (Telemedicine).  Patients are able to view lab/test results, encounter notes, upcoming appointments, etc.  Non-urgent messages can be sent to your provider as well.   To learn more about what you can do with MyChart, go to [email or web address].    Your next appointment:   3 month(s)  The format for your next appointment:   In Person  Provider:   Lauree Chandler, MD   Other Instructions      Signed, Leanor Kail, PA  12/30/2019 3:51 PM    [place]

## 2019-12-30 NOTE — Patient Instructions (Signed)
Medication Instructions:  Your physician recommends that you continue on your current medications as directed. Please refer to the Current Medication list given to you today.  *If you need a refill on your cardiac medications before your next appointment, please call your pharmacy*   Lab Work: None ordered  If you have labs (blood work) drawn today and your tests are completely normal, you will receive your results only by: Marland Kitchen MyChart Message (if you have MyChart) OR . A paper copy in the mail If you have any lab test that is abnormal or we need to change your treatment, we will call you to review the results.   Testing/Procedures: None ordered   Follow-Up: At [facility], you and your health needs are our priority.  As part of our continuing mission to provide you with exceptional heart care, we have created designated Provider Care Teams.  These Care Teams include your primary Cardiologist (physician) and Advanced Practice Providers (APPs -  Physician Assistants and Nurse Practitioners) who all work together to provide you with the care you need, when you need it.  We recommend signing up for the patient portal called "MyChart".  Sign up information is provided on this After Visit Summary.  MyChart is used to connect with patients for Virtual Visits (Telemedicine).  Patients are able to view lab/test results, encounter notes, upcoming appointments, etc.  Non-urgent messages can be sent to your provider as well.   To learn more about what you can do with MyChart, go to [email or web address].    Your next appointment:   3 month(s)  The format for your next appointment:   In Person  Provider:   Lauree Chandler, MD   Other Instructions

## 2019-12-31 DIAGNOSIS — I441 Atrioventricular block, second degree: Secondary | ICD-10-CM | POA: Diagnosis not present

## 2019-12-31 DIAGNOSIS — I2510 Atherosclerotic heart disease of native coronary artery without angina pectoris: Secondary | ICD-10-CM | POA: Diagnosis not present

## 2019-12-31 DIAGNOSIS — I2119 ST elevation (STEMI) myocardial infarction involving other coronary artery of inferior wall: Secondary | ICD-10-CM | POA: Diagnosis not present

## 2019-12-31 DIAGNOSIS — I10 Essential (primary) hypertension: Secondary | ICD-10-CM | POA: Diagnosis not present

## 2020-01-01 DIAGNOSIS — I2119 ST elevation (STEMI) myocardial infarction involving other coronary artery of inferior wall: Secondary | ICD-10-CM | POA: Diagnosis not present

## 2020-01-01 DIAGNOSIS — I10 Essential (primary) hypertension: Secondary | ICD-10-CM | POA: Diagnosis not present

## 2020-01-01 DIAGNOSIS — I441 Atrioventricular block, second degree: Secondary | ICD-10-CM | POA: Diagnosis not present

## 2020-01-01 DIAGNOSIS — I2510 Atherosclerotic heart disease of native coronary artery without angina pectoris: Secondary | ICD-10-CM | POA: Diagnosis not present

## 2020-01-04 ENCOUNTER — Other Ambulatory Visit: Payer: Self-pay | Admitting: *Deleted

## 2020-01-04 DIAGNOSIS — I10 Essential (primary) hypertension: Secondary | ICD-10-CM | POA: Diagnosis not present

## 2020-01-04 DIAGNOSIS — I2510 Atherosclerotic heart disease of native coronary artery without angina pectoris: Secondary | ICD-10-CM | POA: Diagnosis not present

## 2020-01-04 DIAGNOSIS — I441 Atrioventricular block, second degree: Secondary | ICD-10-CM | POA: Diagnosis not present

## 2020-01-04 DIAGNOSIS — I2119 ST elevation (STEMI) myocardial infarction involving other coronary artery of inferior wall: Secondary | ICD-10-CM | POA: Diagnosis not present

## 2020-01-04 MED ORDER — ISOSORBIDE MONONITRATE ER 30 MG PO TB24
30.0000 mg | ORAL_TABLET | Freq: Every day | ORAL | 2 refills | Status: DC
Start: 2020-01-04 — End: 2020-04-07

## 2020-01-05 ENCOUNTER — Encounter: Payer: Self-pay | Admitting: Neurology

## 2020-01-08 DIAGNOSIS — I2510 Atherosclerotic heart disease of native coronary artery without angina pectoris: Secondary | ICD-10-CM | POA: Diagnosis not present

## 2020-01-08 DIAGNOSIS — I2119 ST elevation (STEMI) myocardial infarction involving other coronary artery of inferior wall: Secondary | ICD-10-CM | POA: Diagnosis not present

## 2020-01-08 DIAGNOSIS — I10 Essential (primary) hypertension: Secondary | ICD-10-CM | POA: Diagnosis not present

## 2020-01-08 DIAGNOSIS — I441 Atrioventricular block, second degree: Secondary | ICD-10-CM | POA: Diagnosis not present

## 2020-01-12 DIAGNOSIS — I10 Essential (primary) hypertension: Secondary | ICD-10-CM | POA: Diagnosis not present

## 2020-01-12 DIAGNOSIS — I2510 Atherosclerotic heart disease of native coronary artery without angina pectoris: Secondary | ICD-10-CM | POA: Diagnosis not present

## 2020-01-12 DIAGNOSIS — I2119 ST elevation (STEMI) myocardial infarction involving other coronary artery of inferior wall: Secondary | ICD-10-CM | POA: Diagnosis not present

## 2020-01-12 DIAGNOSIS — I441 Atrioventricular block, second degree: Secondary | ICD-10-CM | POA: Diagnosis not present

## 2020-03-12 DIAGNOSIS — Z20822 Contact with and (suspected) exposure to covid-19: Secondary | ICD-10-CM | POA: Diagnosis not present

## 2020-03-21 DIAGNOSIS — E7800 Pure hypercholesterolemia, unspecified: Secondary | ICD-10-CM | POA: Diagnosis not present

## 2020-03-21 DIAGNOSIS — E039 Hypothyroidism, unspecified: Secondary | ICD-10-CM | POA: Diagnosis not present

## 2020-03-21 DIAGNOSIS — I10 Essential (primary) hypertension: Secondary | ICD-10-CM | POA: Diagnosis not present

## 2020-03-24 DIAGNOSIS — I25119 Atherosclerotic heart disease of native coronary artery with unspecified angina pectoris: Secondary | ICD-10-CM | POA: Diagnosis not present

## 2020-03-24 DIAGNOSIS — Z299 Encounter for prophylactic measures, unspecified: Secondary | ICD-10-CM | POA: Diagnosis not present

## 2020-03-24 DIAGNOSIS — I4891 Unspecified atrial fibrillation: Secondary | ICD-10-CM | POA: Diagnosis not present

## 2020-04-07 ENCOUNTER — Ambulatory Visit: Payer: Medicare Other | Admitting: Neurology

## 2020-04-07 ENCOUNTER — Encounter: Payer: Self-pay | Admitting: Neurology

## 2020-04-07 ENCOUNTER — Ambulatory Visit: Payer: Medicare Other | Admitting: Cardiovascular Disease

## 2020-04-07 ENCOUNTER — Other Ambulatory Visit: Payer: Self-pay

## 2020-04-07 VITALS — BP 99/62 | HR 66 | Ht 67.0 in | Wt 187.6 lb

## 2020-04-07 DIAGNOSIS — F02818 Dementia in other diseases classified elsewhere, unspecified severity, with other behavioral disturbance: Secondary | ICD-10-CM

## 2020-04-07 DIAGNOSIS — G3183 Dementia with Lewy bodies: Secondary | ICD-10-CM | POA: Diagnosis not present

## 2020-04-07 DIAGNOSIS — F0281 Dementia in other diseases classified elsewhere with behavioral disturbance: Secondary | ICD-10-CM

## 2020-04-07 DIAGNOSIS — R29898 Other symptoms and signs involving the musculoskeletal system: Secondary | ICD-10-CM | POA: Diagnosis not present

## 2020-04-07 MED ORDER — RISPERIDONE 0.5 MG PO TABS
ORAL_TABLET | ORAL | 3 refills | Status: DC
Start: 2020-04-07 — End: 2020-08-10

## 2020-04-07 MED ORDER — MEMANTINE HCL 5 MG PO TABS
5.0000 mg | ORAL_TABLET | Freq: Two times a day (BID) | ORAL | 3 refills | Status: AC
Start: 2020-04-07 — End: ?

## 2020-04-07 NOTE — Progress Notes (Signed)
NEUROLOGY CONSULTATION NOTE  Tony Chapman MRN: [id number] DOB: 03/11/1940  Referring provider: Dr. Jerene Bears Primary care provider: Dr. Jerene Bears  Reason for consult:  Memory loss  Dear Dr Woody Seller:  Thank you for your kind referral of Tony Chapman for consultation of the above symptoms. Although his history is well known to you, please allow me to reiterate it for the purpose of our medical record. The Chapman was accompanied to the clinic by his caregiver Tony Chapman, his son Tony Chapman and daughter-in-law Tony Chapman are present on video conference to provide collateral information. Records and images were personally reviewed where available.   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 His daughter-in-law Tony Chapman, son Tony Chapman, and caregiver Tony Chapman are present to provide information. He states his memory is not good. Tony Chapman reports symptoms started in 2018, he became delusional and paranoid, seeing things and people that were not there, thinking people were watching him. He was living alone at that time. He was still managing finances and medications up until 2019, when he asked Tony Chapman to take over finances. He himself knew things were changing and Tony Chapman denies any discrepancies with his finances when he took over. He was having difficulty with medications, doubling them up, Tony Chapman started setting them up for the week and he would remember to take them. He denied getting lost driving, he lost his license in 0174 after cognitive testing with his PCP. Note of MMSE 17/30 in 2018 at PCP office. He was started on Donepezil 10mg  daily, Memantine 5mg  daily was added in 2019. Tony Chapman started helping in 2019. He had a STEMI in 12/2019, and since then they noticed a difference in his mobility and hand/feet  function. Movements were slower, and when asked to do things he thinks for a long time just to move a limb. He has required 24/7 care since then. He was also having agitation and sundowning, and Risperidone was added. Tony Chapman reports they alternate Risperidone 0.5mg  every other night with Benadryl on the other nights, and he sleeps well when he stays with Tony Chapman and Tony Chapman 4 days a week, however was up at 2am last night with [name]. When he is out of his routine/environment, he can get more disoriented and agitated, improving when he is back home. He is usually pretty quiet and mellow but Tony Chapman can tell a difference in his moods. He has not been having significant paranoia anymore, Tony Chapman recalls only one episode 2 months ago where he was seeing a deceased friend. He needs assistance with dressing and bathing, some days he can do it, other times they step in because of his difficulties with mobility. He has tried to wander out of the house, alarms are now set up. No REM behavior disorder.   He is drowsy in the office today. He is easily arousable and follows commands. He denies any headaches, dizziness, diplopia, dysarthria/dysphagia, neck pain, focal numbness/tingling/weakness, anosmia. He reports back pain. He reports both feet feel numb. He wears Depends and sometimes has bowel/bladder incontinence. No family history of dementia, no significant head injuries or alcohol use. He used to work as a Company secretary. He retired from [employer] 7 years ago and was working as a Art gallery manager until 9 months ago.   I personally reviewed MRI brain without contrast done at [facility] in 12/2016 which did not show  any acute changes, there was mild diffuse atrophy, mild chronic microvascular disease.     PAST MEDICAL HISTORY: Past Medical History:  Diagnosis Date  . CAD (coronary artery disease)   . Dementia ([place])   . DVT (deep venous thrombosis) ([place])   . HLD (hyperlipidemia)   . HTN (hypertension)   .  Hypothyroid   . Lumbar spondylosis   . Prostate cancer ([place])     PAST SURGICAL HISTORY: Past Surgical History:  Procedure Laterality Date  . BACK SURGERY    . CORONARY/GRAFT ACUTE MI REVASCULARIZATION N/A 12/11/2019   Procedure: Coronary/Graft Acute MI Revascularization;  Surgeon: Burnell Blanks, MD;  Location: [place] [facility];  Service: Cardiovascular;  Laterality: N/A;  . LEFT HEART CATH AND CORONARY ANGIOGRAPHY N/A 12/11/2019   Procedure: LEFT HEART CATH AND CORONARY ANGIOGRAPHY;  Surgeon: Burnell Blanks, MD;  Location: [place] [facility];  Service: Cardiovascular;  Laterality: N/A;    MEDICATIONS: Current Outpatient Medications on File Prior to Visit  Medication Sig Dispense Refill  . amLODipine (NORVASC) 10 MG tablet Take 10 mg by mouth daily.    Marland Kitchen aspirin EC 81 MG tablet Take 81 mg by mouth daily. Swallow whole.    Marland Kitchen atorvastatin (LIPITOR) 20 MG tablet Take 20 mg by mouth daily.    Marland Kitchen atorvastatin (LIPITOR) 80 MG tablet Take 80 mg by mouth daily.    . benazepril (LOTENSIN) 20 MG tablet Take 20 mg by mouth daily.    . diclofenac Sodium (VOLTAREN) 1 % GEL Apply topically 4 (four) times daily.    . diphenhydrAMINE (BENADRYL) 25 MG tablet Take 25 mg by mouth every 6 (six) hours as needed for sleep.    Marland Kitchen donepezil (ARICEPT) 10 MG tablet Take 10 mg by mouth at bedtime.    . furosemide (LASIX) 40 MG tablet Take 40 mg by mouth See admin instructions. Take two times per week by mouth    . levothyroxine (SYNTHROID) 100 MCG tablet Take 100 mcg by mouth daily before breakfast.    . memantine (NAMENDA) 5 MG tablet Take 5 mg by mouth daily.    . nitroGLYCERIN (NITROSTAT) 0.4 MG SL tablet Place 1 tablet (0.4 mg total) under the tongue every 5 (five) minutes as needed. 25 tablet 2  . potassium chloride (KLOR-CON) 10 MEQ tablet Take 10 mEq by mouth See admin instructions. Take two times per week by mouth    . risperiDONE (RISPERDAL) 0.5 MG tablet Take 0.5 mg by mouth at  bedtime.    . ticagrelor (BRILINTA) 90 MG TABS tablet Take 1 tablet (90 mg total) by mouth 2 (two) times daily. 180 tablet 2   No current facility-administered medications on file prior to visit.    ALLERGIES: No Known Allergies  FAMILY HISTORY: History reviewed. No pertinent family history.  SOCIAL HISTORY: Social History   Socioeconomic History  . Marital status: Divorced    Spouse name: Not on file  . Number of children: Not on file  . Years of education: Not on file  . Highest education level: Not on file  Occupational History  . Occupation: pastor  Tobacco Use  . Smoking status: Never Smoker  . Smokeless tobacco: Never Used  Vaping Use  . Vaping Use: Never used  Substance and Sexual Activity  . Alcohol use: Not Currently  . Drug use: Never  . Sexual activity: Not Currently  Other Topics Concern  . Not on file  Social History Narrative   Right handed  Lives at home with family lives with wife    Social Determinants of Health   Financial Resource Strain: Low Risk   . Difficulty of Paying Living Expenses: Not very hard  Food Insecurity: No Food Insecurity  . Worried About Charity fundraiser in the Last Year: Never true  . Ran Out of Food in the Last Year: Never true  Transportation Needs: No Transportation Needs  . Lack of Transportation (Medical): No  . Lack of Transportation (Non-Medical): No  Physical Activity: Insufficiently Active  . Days of Exercise per Week: 7 days  . Minutes of Exercise per Session: 10 min  Stress: No Stress Concern Present  . Feeling of Stress : Only a little  Social Connections: Moderately Integrated  . Frequency of Communication with Friends and Family: More than three times a week  . Frequency of Social Gatherings with Friends and Family: More than three times a week  . Attends Religious Services: More than 4 times per year  . Active Member of Clubs or Organizations: No  . Attends Archivist Meetings: Never  .  Marital Status: Married  Human resources officer Violence: Not At Risk  . Fear of Current or Ex-Partner: No  . Emotionally Abused: No  . Physically Abused: No  . Sexually Abused: No     PHYSICAL EXAM: Vitals:   04/07/20 1028  BP: 99/62  Pulse: 66  SpO2: 99%   General: No acute distress, drowsy but easily arousable Head:  Normocephalic/atraumatic Skin/Extremities: No rash, no edema Neurological Exam: Mental status: alert and oriented to person. No dysarthria or aphasia, reduced fluency. Fund of knowledge is reduced.  Recent and remote memory are impaired. Attention and concentration are reduced.    Able to name objects, unable to repeat phrases. Reynolds score 5/30. Montreal Cognitive Assessment  04/07/2020  Visuospatial/ Executive (0/5) 1  Naming (0/3) 2  Attention: Read list of digits (0/2) 0  Attention: Read list of letters (0/1) 0  Attention: Serial 7 subtraction starting at 100 (0/3) 0  Language: Repeat phrase (0/2) 0  Language : Fluency (0/1) 0  Abstraction (0/2) 1  Delayed Recall (0/5) 0  Orientation (0/6) 0  Total 4  Adjusted Score (based on education) 5    Cranial nerves: CN I: not tested CN II: pupils equal, round and reactive to light, visual fields intact CN III, IV, VI:  full range of motion, no nystagmus, no ptosis CN V: facial sensation intact CN VII: upper and lower face symmetric CN VIII: hearing intact to conversation CN IX, X: gag intact, uvula midline CN XI: sternocleidomastoid and trapezius muscles intact CN XII: tongue midline Bulk & Tone: normal, no fasciculations, +cogwheeling. Motor: 5/5 throughout with no pronator drift. 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 Constellation of symptoms, particularly initial symptoms of hallucinations/delusions, and now mobility issues (bradykinetic with cogwheeling on exam today), raises concern for Lewy body dementia as etiology of dementia. Since his heart attack in 12/2019, there has been more  decline in mobility, he appears to be dragging his left foot. MRI brain without contrast will be ordered to assess for underlying structural abnormality. We discussed medications used in dementia, continue Donepezil 10mg  daily, increase Memantine to 5mg  BID. Family feels he may benefit as well with increasing Risperdal for behaviors, we will increase slowly, we discussed side effects including cardiac black box warning with antipsychotics in dementia. Increase Risperdal 0.5mg  to 1.5 tabs every other night. Continue 24/7 care. Follow-up in 6 months, they know to call for any changes.    Thank you for allowing me to participate in the care of this Chapman. Please do not hesitate to call for any questions or concerns.   Ellouise Newer, M.D.  CC: Dr. Woody Seller

## 2020-04-07 NOTE — Patient Instructions (Signed)
1. Schedule MRI brain without contrast  2. Increase Memantine 5mg : Take 1 tablet twice a day  3. Increase Risperidone 0.5mg : take 1 and 1/2 tablets every other night  4. Continue 24/7 care  5. Follow-up in 6-7 months, call for any changes

## 2020-04-17 NOTE — Progress Notes (Signed)
Cardiology Office Note:    Date:  04/20/2020   ID:  Tony Chapman, DOB [birth year], MRN [id number]  PCP:  Glenda Chroman, MD  [facility] Cardiologist:  Lauree Chandler, MD  [facility] Electrophysiologist:  None   Chief Complaint: 3 months follow up   History of Present Illness:    Tony Chapman is a 80 y.o. male with a hx of CAD, HTN, HLD dementia, hypothyroidism, second-degree Mobitz 1 AV block and history of DVT seen for follow-up.   History of CAD s/p PCI to unknown artery at [facility]. Admitted 11/2019 with inferior STEMI. Underwent cardiac cath noted below with balloon angioplasty to the pRCA with poor result and occluded vessel distally. Vessel noted to be severely calcified with diffuse disease. Films were reviewed by Dr. [place] and he felt to be a poor candidate for further PCI and with advanced dementia. Plan for medical therapy/conservative approach. Placed on ASA/Brilinta, statin increased to atorvastatin 80mg , restarted amlodipine 5mg  daily and added Imdur 30mg  daily. No BB due to 2nd degree AVB, Mobitz I, felt will resolve s/p inferior MI.   He was doing well when last seen 12/30/2019 for follow up.   Here today for follow up with daughter who provided history.  Patient with advanced dementia.  This is progressing.  Lives with daughter for 5 days and 2 days at home with 24/7 care.  Minimal walking.  He does good walking on flat surface however he gets short of breath while climbing stairs at daughter's house.  No reported chest pain, palpitation, orthopnea, PND, syncope or lower extremity edema.  Past Medical History:  Diagnosis Date  . CAD (coronary artery disease)   . Dementia ([place])   . DVT (deep venous thrombosis) ([place])   . HLD (hyperlipidemia)   . HTN (hypertension)   . Hypothyroid   . Lumbar spondylosis   . Prostate cancer [facility])     Past Surgical History:  Procedure Laterality Date  . BACK SURGERY    . CORONARY/GRAFT ACUTE MI  REVASCULARIZATION N/A 12/11/2019   Procedure: Coronary/Graft Acute MI Revascularization;  Surgeon: Burnell Blanks, MD;  Location: [place] [facility];  Service: Cardiovascular;  Laterality: N/A;  . LEFT HEART CATH AND CORONARY ANGIOGRAPHY N/A 12/11/2019   Procedure: LEFT HEART CATH AND CORONARY ANGIOGRAPHY;  Surgeon: Burnell Blanks, MD;  Location: [place] [facility];  Service: Cardiovascular;  Laterality: N/A;    Current Medications: Current Meds  Medication Sig  . amLODipine (NORVASC) 10 MG tablet Take 10 mg by mouth daily.  Marland Kitchen aspirin EC 81 MG tablet Take 81 mg by mouth daily. Swallow whole.  Marland Kitchen atorvastatin (LIPITOR) 80 MG tablet Take 80 mg by mouth daily.  . benazepril (LOTENSIN) 20 MG tablet Take 20 mg by mouth daily.  . diclofenac Sodium (VOLTAREN) 1 % GEL Apply topically 4 (four) times daily.  . diphenhydrAMINE (BENADRYL) 25 MG tablet Take 25 mg by mouth every 6 (six) hours as needed for sleep.  Marland Kitchen donepezil (ARICEPT) 10 MG tablet Take 10 mg by mouth at bedtime.  . furosemide (LASIX) 40 MG tablet Take 40 mg by mouth See admin instructions. Take two times per week by mouth  . levothyroxine (SYNTHROID) 100 MCG tablet Take 100 mcg by mouth daily before breakfast.  . memantine (NAMENDA) 5 MG tablet Take 1 tablet (5 mg total) by mouth 2 (two) times daily.  . nitroGLYCERIN (NITROSTAT) 0.4 MG SL tablet Place 1 tablet (0.4 mg total) under the  tongue every 5 (five) minutes as needed.  . potassium chloride (KLOR-CON) 10 MEQ tablet Take 10 mEq by mouth See admin instructions. Take two times per week by mouth  . risperiDONE (RISPERDAL) 0.5 MG tablet Take 1 and 1/2 tablets every other night  . ticagrelor (BRILINTA) 90 MG TABS tablet Take 1 tablet (90 mg total) by mouth 2 (two) times daily.     Allergies:   Patient has no known allergies.   Social History   Socioeconomic History  . Marital status: Divorced    Spouse name: Not on file  . Number of children: Not on file  . Years  of education: Not on file  . Highest education level: Not on file  Occupational History  . Occupation: pastor  Tobacco Use  . Smoking status: Never Smoker  . Smokeless tobacco: Never Used  Vaping Use  . Vaping Use: Never used  Substance and Sexual Activity  . Alcohol use: Not Currently  . Drug use: Never  . Sexual activity: Not Currently  Other Topics Concern  . Not on file  Social History Narrative   Right handed    Lives at home with family lives with wife    Social Determinants of Health   Financial Resource Strain: Low Risk   . Difficulty of Paying Living Expenses: Not very hard  Food Insecurity: No Food Insecurity  . Worried About Charity fundraiser in the Last Year: Never true  . Ran Out of Food in the Last Year: Never true  Transportation Needs: No Transportation Needs  . Lack of Transportation (Medical): No  . Lack of Transportation (Non-Medical): No  Physical Activity: Insufficiently Active  . Days of Exercise per Week: 7 days  . Minutes of Exercise per Session: 10 min  Stress: No Stress Concern Present  . Feeling of Stress : Only a little  Social Connections: Moderately Integrated  . Frequency of Communication with Friends and Family: More than three times a week  . Frequency of Social Gatherings with Friends and Family: More than three times a week  . Attends Religious Services: More than 4 times per year  . Active Member of Clubs or Organizations: No  . Attends Archivist Meetings: Never  . Marital Status: Married     Family History: The patient's family history is not on file.    ROS:   Please see the history of present illness.    All other systems reviewed and are negative.   EKGs/Labs/Other Studies Reviewed:    The following studies were reviewed today:  Cath: 12/11/19   Prox RCA to Mid RCA lesion is 60% stenosed.  Prox RCA lesion is 100% stenosed.  Mid RCA to Dist RCA lesion is 100% stenosed.  Ost Cx to Prox Cx lesion is 100%  stenosed.  1st Mrg lesion is 100% stenosed.  Ost LAD to Mid LAD lesion is 40% stenosed.  2nd Diag lesion is 99% stenosed.  Mid LAD lesion is 40% stenosed.  Dist LAD lesion is 99% stenosed.  Balloon angioplasty was performed using a BALLOON SAPPHIRE 2.5X15.  Post intervention, there is a 80% residual stenosis.  Post intervention, there is a 60% residual stenosis.  1. The LAD is patent to the apex. Moderate calcific disease in the proximal and mid LAD. Severe apical LAD stenosis. The small caliber second diagonal branch has a severe mid stenosis.  2. Chronic occlusion of the ostial Circumflex.  3. Proximal occlusion of the RCA. The entire vessel is heavily  calcified. There is a stent in the mid vessel. The distal lesion is felt to be a chronic total occlusion. The distal branches fill from left to right collaterals.   Recommendations: Will admit to the ICU. Will continue ASA, Brilinta and statin. Resume other home medications tomorrow after pharmacy medication review. I will continue Aggrastat for 18 hours. Will review films with the IC team but I do not think further PCI of the RCA is feasible given the chronic occlusion distally and heavy calcification of the entire vessel. He is pain free with a stable blood pressure. Anticipate conservative management from this point given his advanced dementia.   Diagnostic Dominance: Right  Intervention    Echo: 12/12/19  IMPRESSIONS    1. Left ventricular ejection fraction, by estimation, is 50 to 55%. The  left ventricle has low normal function. The left ventricle demonstrates  regional wall motion abnormalities (see scoring diagram/findings for  description). Left ventricular diastolic  parameters are indeterminate. There is hypokinesis of the left  ventricular, basal inferior segment, inferoseptal wall and inferior wall.  2. Right ventricular systolic function is normal. The right ventricular  size is normal. There is  normal pulmonary artery systolic pressure.  3. Left atrial size was mildly dilated.  4. The mitral valve is normal in structure. Trivial mitral valve  regurgitation.  5. The aortic valve is tricuspid. There is mild calcification of the  aortic valve. There is mild thickening of the aortic valve. Aortic valve  regurgitation is not visualized. Mild aortic valve sclerosis is present,  with no evidence of aortic valve  stenosis.  6. Aortic dilatation noted. There is mild dilatation of the ascending  aorta, measuring 39 mm.  7. The inferior vena cava is normal in size with greater than 50%  respiratory variability, suggesting right atrial pressure of 3 mmHg.   Comparison(s): No prior Echocardiogram.   Conclusion(s)/Recommendation(s): Hypokinesis of basal inferior and  inferoseptal wall with overall low normal EF. No significant valve  disease.    EKG:  EKG is not  ordered today.   Recent Labs: 12/11/2019: ALT 51 12/12/2019: Hemoglobin 11.8; Platelets 225 12/15/2019: BUN 13; Creatinine, Ser 0.99; Potassium 4.0; Sodium 141  Recent Lipid Panel    Component Value Date/Time   CHOL 161 12/11/2019 2315   TRIG 139 12/11/2019 2315   HDL 49 12/11/2019 2315   CHOLHDL 3.3 12/11/2019 2315   VLDL 28 12/11/2019 2315   LDLCALC 84 12/11/2019 2315    Physical Exam:    VS:  BP (!) 120/48   Pulse 68   Ht 5\' 7"  (1.702 m)   Wt 186 lb (84.4 kg)   SpO2 97%   BMI 29.13 kg/m     Wt Readings from Last 3 Encounters:  04/20/20 186 lb (84.4 kg)  04/07/20 187 lb 9.6 oz (85.1 kg)  12/30/19 180 lb (81.6 kg)     GEN: Well nourished, well developed in no acute distress HEENT: Normal NECK: No JVD; No carotid bruits LYMPHATICS: No lymphadenopathy CARDIAC: RRR, no murmurs, rubs, gallops RESPIRATORY:  Clear to auscultation without rales, wheezing or rhonchi  ABDOMEN: Soft, non-tender, non-distended MUSCULOSKELETAL:  No edema; No deformity  SKIN: Warm and dry NEUROLOGIC:  Dementia   PSYCHIATRIC:  Dementia   ASSESSMENT AND PLAN:    1. CAD -No reported angina.  Continue dual antiplatelet therapy with aspirin and Brilinta.  Continue statin and Imdur.  Not on beta-blocker due to prior history of bradycardia with MI.  2. HTN Stable and controlled  on current medication  3. HLD Continue statin  Patient has lab work scheduled with PCP later this month.   Medication Adjustments/Labs and Tests Ordered: Current medicines are reviewed at length with the patient today.  Concerns regarding medicines are outlined above.  No orders of the defined types were placed in this encounter.  No orders of the defined types were placed in this encounter.   Patient Instructions  Medication Instructions:  Your physician recommends that you continue on your current medications as directed. Please refer to the Current Medication list given to you today.  *If you need a refill on your cardiac medications before your next appointment, please call your pharmacy*   Lab Work: None ordered  If you have labs (blood work) drawn today and your tests are completely normal, you will receive your results only by: Marland Kitchen MyChart Message (if you have MyChart) OR . A paper copy in the mail If you have any lab test that is abnormal or we need to change your treatment, we will call you to review the results.   Testing/Procedures: None ordered   Follow-Up: At [facility], you and your health needs are our priority.  As part of our continuing mission to provide you with exceptional heart care, we have created designated Provider Care Teams.  These Care Teams include your primary Cardiologist (physician) and Advanced Practice Providers (APPs -  Physician Assistants and Nurse Practitioners) who all work together to provide you with the care you need, when you need it.  We recommend signing up for the patient portal called "MyChart".  Sign up information is provided on this After Visit Summary.   MyChart is used to connect with patients for Virtual Visits (Telemedicine).  Patients are able to view lab/test results, encounter notes, upcoming appointments, etc.  Non-urgent messages can be sent to your provider as well.   To learn more about what you can do with MyChart, go to [email or web address].    Your next appointment:   6 month(s)  The format for your next appointment:   In Person  Provider:   You may see Lauree Chandler, MD or one of the following Advanced Practice Providers on your designated Care Team:    Melina Copa, PA-C  Ermalinda Barrios, PA-C    Other Instructions      Signed, Leanor Kail, [place]  04/20/2020 11:53 AM    [place]

## 2020-04-20 ENCOUNTER — Other Ambulatory Visit: Payer: Self-pay

## 2020-04-20 ENCOUNTER — Encounter: Payer: Self-pay | Admitting: Physician Assistant

## 2020-04-20 ENCOUNTER — Ambulatory Visit: Payer: Medicare Other | Admitting: Physician Assistant

## 2020-04-20 VITALS — BP 120/48 | HR 68 | Ht 67.0 in | Wt 186.0 lb

## 2020-04-20 DIAGNOSIS — I441 Atrioventricular block, second degree: Secondary | ICD-10-CM

## 2020-04-20 DIAGNOSIS — I2510 Atherosclerotic heart disease of native coronary artery without angina pectoris: Secondary | ICD-10-CM | POA: Diagnosis not present

## 2020-04-20 DIAGNOSIS — E782 Mixed hyperlipidemia: Secondary | ICD-10-CM

## 2020-04-20 DIAGNOSIS — I10 Essential (primary) hypertension: Secondary | ICD-10-CM

## 2020-04-20 NOTE — Patient Instructions (Signed)
Medication Instructions:  Your physician recommends that you continue on your current medications as directed. Please refer to the Current Medication list given to you today.  *If you need a refill on your cardiac medications before your next appointment, please call your pharmacy*   Lab Work: None ordered  If you have labs (blood work) drawn today and your tests are completely normal, you will receive your results only by: MyChart Message (if you have MyChart) OR A paper copy in the mail If you have any lab test that is abnormal or we need to change your treatment, we will call you to review the results.   Testing/Procedures: None ordered   Follow-Up: At [facility], you and your health needs are our priority.  As part of our continuing mission to provide you with exceptional heart care, we have created designated Provider Care Teams.  These Care Teams include your primary Cardiologist (physician) and Advanced Practice Providers (APPs -  Physician Assistants and Nurse Practitioners) who all work together to provide you with the care you need, when you need it.  We recommend signing up for the patient portal called "MyChart".  Sign up information is provided on this After Visit Summary.  MyChart is used to connect with patients for Virtual Visits (Telemedicine).  Patients are able to view lab/test results, encounter notes, upcoming appointments, etc.  Non-urgent messages can be sent to your provider as well.   To learn more about what you can do with MyChart, go to [email or web address].    Your next appointment:   6 month(s)  The format for your next appointment:   In Person  Provider:   You may see Christopher McAlhany, MD or one of the following Advanced Practice Providers on your designated Care Team:   Dayna Dunn, PA-C Michele Lenze, PA-C   Other Instructions   

## 2020-05-04 ENCOUNTER — Ambulatory Visit
Admission: RE | Admit: 2020-05-04 | Discharge: 2020-05-04 | Disposition: A | Discharge status: Home / Self Care | Payer: Medicare Other | Source: Ambulatory Visit | Attending: Neurology | Admitting: Neurology

## 2020-05-04 ENCOUNTER — Other Ambulatory Visit: Payer: Self-pay

## 2020-05-04 DIAGNOSIS — G319 Degenerative disease of nervous system, unspecified: Secondary | ICD-10-CM | POA: Diagnosis not present

## 2020-05-04 DIAGNOSIS — F0281 Dementia in other diseases classified elsewhere with behavioral disturbance: Secondary | ICD-10-CM

## 2020-05-04 DIAGNOSIS — J3489 Other specified disorders of nose and nasal sinuses: Secondary | ICD-10-CM | POA: Diagnosis not present

## 2020-05-04 DIAGNOSIS — F02818 Dementia in other diseases classified elsewhere, unspecified severity, with other behavioral disturbance: Secondary | ICD-10-CM

## 2020-05-04 DIAGNOSIS — R413 Other amnesia: Secondary | ICD-10-CM | POA: Diagnosis not present

## 2020-05-04 DIAGNOSIS — R29898 Other symptoms and signs involving the musculoskeletal system: Secondary | ICD-10-CM

## 2020-05-12 DIAGNOSIS — I10 Essential (primary) hypertension: Secondary | ICD-10-CM | POA: Diagnosis not present

## 2020-05-12 DIAGNOSIS — Z789 Other specified health status: Secondary | ICD-10-CM | POA: Diagnosis not present

## 2020-05-12 DIAGNOSIS — Z299 Encounter for prophylactic measures, unspecified: Secondary | ICD-10-CM | POA: Diagnosis not present

## 2020-05-12 DIAGNOSIS — E7800 Pure hypercholesterolemia, unspecified: Secondary | ICD-10-CM | POA: Diagnosis not present

## 2020-05-12 DIAGNOSIS — R5383 Other fatigue: Secondary | ICD-10-CM | POA: Diagnosis not present

## 2020-05-12 DIAGNOSIS — Z79899 Other long term (current) drug therapy: Secondary | ICD-10-CM | POA: Diagnosis not present

## 2020-05-12 DIAGNOSIS — E039 Hypothyroidism, unspecified: Secondary | ICD-10-CM | POA: Diagnosis not present

## 2020-05-12 DIAGNOSIS — Z7189 Other specified counseling: Secondary | ICD-10-CM | POA: Diagnosis not present

## 2020-05-12 DIAGNOSIS — Z0000 Encounter for general adult medical examination without abnormal findings: Secondary | ICD-10-CM | POA: Diagnosis not present

## 2020-05-18 DIAGNOSIS — E039 Hypothyroidism, unspecified: Secondary | ICD-10-CM | POA: Diagnosis not present

## 2020-05-18 DIAGNOSIS — E7800 Pure hypercholesterolemia, unspecified: Secondary | ICD-10-CM | POA: Diagnosis not present

## 2020-05-18 DIAGNOSIS — I10 Essential (primary) hypertension: Secondary | ICD-10-CM | POA: Diagnosis not present

## 2020-05-18 DIAGNOSIS — M1990 Unspecified osteoarthritis, unspecified site: Secondary | ICD-10-CM | POA: Diagnosis not present

## 2020-06-08 DIAGNOSIS — Z299 Encounter for prophylactic measures, unspecified: Secondary | ICD-10-CM | POA: Diagnosis not present

## 2020-06-08 DIAGNOSIS — I82401 Acute embolism and thrombosis of unspecified deep veins of right lower extremity: Secondary | ICD-10-CM | POA: Diagnosis not present

## 2020-06-08 DIAGNOSIS — I2510 Atherosclerotic heart disease of native coronary artery without angina pectoris: Secondary | ICD-10-CM | POA: Diagnosis not present

## 2020-06-08 DIAGNOSIS — R319 Hematuria, unspecified: Secondary | ICD-10-CM | POA: Diagnosis not present

## 2020-06-16 ENCOUNTER — Telehealth: Payer: Self-pay | Admitting: Cardiovascular Disease

## 2020-06-16 ENCOUNTER — Encounter: Payer: Self-pay | Admitting: Cardiovascular Disease

## 2020-06-16 NOTE — Progress Notes (Signed)
Cardiology Office Note:    Date:  06/17/2020   ID:  Tony Chapman, DOB Feb 20, 1940, MRN [id number]  PCP:  Glenda Chroman, MD   [place]  Cardiologist:  Lauree Chandler, MD  Advanced Practice Provider:  No care team member to display Electrophysiologist:  None   Referring MD: Glenda Chroman, MD   Chief Complaint  Patient presents with  . Coronary Artery Disease    June 17, 2020   Tony Chapman is a 80 y.o. male with a hx of CAD, dementia, HLD, HTN.  Seen with daughter in law - Randell Patient I am seeing him today as a DOD work in visit for worsening dyspnea and leg edema . Several weeks of increaed swelling  She increaed his lasix and KCl from 2 days a week to 5 times a week .  He wears diapers,   Randell Patient has not noticed much of an increase in his urine output  Did not help much  Lost more congestion  Increasing shortness of breath   Usually walks around , he has not had the strength to walk for the past couple days and his daughter-in-law has him in a wheelchair. Diet seems to be good ,  Charlene fixes his meals .   Has moderate - severe dementia,  Difficult to keep him in a position of leg elevated.   Its getting more and more difficult getting him to appointments and  He is falling more frequently   EF is 50-55% by echo in Oct. 2021   Past Medical History:  Diagnosis Date  . CAD (coronary artery disease)   . Dementia ([place])   . DVT (deep venous thrombosis) ([place])   . HLD (hyperlipidemia)   . HTN (hypertension)   . Hypothyroid   . Lumbar spondylosis   . Prostate cancer [facility])     Past Surgical History:  Procedure Laterality Date  . BACK SURGERY    . CORONARY/GRAFT ACUTE MI REVASCULARIZATION N/A 12/11/2019   Procedure: Coronary/Graft Acute MI Revascularization;  Surgeon: Burnell Blanks, MD;  Location: [facility];  Service: Cardiovascular;  Laterality: N/A;  . LEFT HEART CATH AND CORONARY ANGIOGRAPHY N/A 12/11/2019    Procedure: LEFT HEART CATH AND CORONARY ANGIOGRAPHY;  Surgeon: Burnell Blanks, MD;  Location: [facility];  Service: Cardiovascular;  Laterality: N/A;    Current Medications: Current Meds  Medication Sig  . amLODipine (NORVASC) 5 MG tablet Take 1 tablet (5 mg total) by mouth daily.  Marland Kitchen aspirin EC 81 MG tablet Take 81 mg by mouth daily. Swallow whole.  Marland Kitchen atorvastatin (LIPITOR) 80 MG tablet Take 80 mg by mouth daily.  . benazepril (LOTENSIN) 20 MG tablet Take 20 mg by mouth daily.  . diclofenac Sodium (VOLTAREN) 1 % GEL Apply topically 4 (four) times daily.  . diphenhydrAMINE (BENADRYL) 25 MG tablet Take 25 mg by mouth every 6 (six) hours as needed for sleep.  Marland Kitchen donepezil (ARICEPT) 10 MG tablet Take 10 mg by mouth at bedtime.  . isosorbide mononitrate (IMDUR) 30 MG 24 hr tablet Take 30 mg by mouth daily.  Marland Kitchen levothyroxine (SYNTHROID) 100 MCG tablet Take 100 mcg by mouth daily before breakfast.  . memantine (NAMENDA) 5 MG tablet Take 1 tablet (5 mg total) by mouth 2 (two) times daily.  . nitroGLYCERIN (NITROSTAT) 0.4 MG SL tablet PLACE 1 TABLET (0.4 MG TOTAL) UNDER THE TONGUE EVERY FIVE MINUTES AS NEEDED.  Marland Kitchen potassium chloride (KLOR-CON) 10 MEQ  tablet Take 10 mEq by mouth See admin instructions. Take two times per week by mouth  . potassium chloride (KLOR-CON) 10 MEQ tablet Take 1 tablet (10 mEq total) by mouth daily.  . risperiDONE (RISPERDAL) 0.5 MG tablet Take 1 and 1/2 tablets every other night  . ticagrelor (BRILINTA) 90 MG TABS tablet Take 1 tablet (90 mg total) by mouth 2 (two) times daily.  Marland Kitchen torsemide (DEMADEX) 20 MG tablet Take 1 tablet (20 mg total) by mouth daily.  . [DISCONTINUED] amLODipine (NORVASC) 10 MG tablet Take 10 mg by mouth daily.  . [DISCONTINUED] furosemide (LASIX) 40 MG tablet Take 40 mg by mouth See admin instructions. 5 TIMES A WEEK  . [DISCONTINUED] isosorbide mononitrate (IMDUR) 30 MG 24 hr tablet Take 1 tablet (30 mg total) by mouth daily.      Allergies:   Patient has no known allergies.   Social History   Socioeconomic History  . Marital status: Divorced    Spouse name: Not on file  . Number of children: Not on file  . Years of education: Not on file  . Highest education level: Not on file  Occupational History  . Occupation: pastor  Tobacco Use  . Smoking status: Never Smoker  . Smokeless tobacco: Never Used  Vaping Use  . Vaping Use: Never used  Substance and Sexual Activity  . Alcohol use: Not Currently  . Drug use: Never  . Sexual activity: Not Currently  Other Topics Concern  . Not on file  Social History Narrative   Right handed    Lives at home with family lives with wife    Social Determinants of Health   Financial Resource Strain: Low Risk   . Difficulty of Paying Living Expenses: Not very hard  Food Insecurity: No Food Insecurity  . Worried About Charity fundraiser in the Last Year: Never true  . Ran Out of Food in the Last Year: Never true  Transportation Needs: No Transportation Needs  . Lack of Transportation (Medical): No  . Lack of Transportation (Non-Medical): No  Physical Activity: Insufficiently Active  . Days of Exercise per Week: 7 days  . Minutes of Exercise per Session: 10 min  Stress: No Stress Concern Present  . Feeling of Stress : Only a little  Social Connections: Moderately Integrated  . Frequency of Communication with Friends and Family: More than three times a week  . Frequency of Social Gatherings with Friends and Family: More than three times a week  . Attends Religious Services: More than 4 times per year  . Active Member of Clubs or Organizations: No  . Attends Archivist Meetings: Never  . Marital Status: Married     Family History: The patient's family history is not on file.  ROS:   Please see the history of present illness.     All other systems reviewed and are negative.  EKGs/Labs/Other Studies Reviewed:    The following studies were reviewed  today:   EKG:  June 17, 2020:   NSR ,   Nonspecific ST changes   Recent Labs: 12/11/2019: ALT 51 12/12/2019: Hemoglobin 11.8; Platelets 225 12/15/2019: BUN 13; Creatinine, Ser 0.99; Potassium 4.0; Sodium 141  Recent Lipid Panel    Component Value Date/Time   CHOL 161 12/11/2019 2315   TRIG 139 12/11/2019 2315   HDL 49 12/11/2019 2315   CHOLHDL 3.3 12/11/2019 2315   VLDL 28 12/11/2019 2315   LDLCALC 84 12/11/2019 2315     Risk  Assessment/Calculations:       Physical Exam:    VS:  BP 106/62   Pulse 62   Ht 5\' 7"  (1.702 m)   Wt 182 lb (82.6 kg)   SpO2 96%   BMI 28.51 kg/m     Wt Readings from Last 3 Encounters:  06/17/20 182 lb (82.6 kg)  04/20/20 186 lb (84.4 kg)  04/07/20 187 lb 9.6 oz (85.1 kg)     GEN:  Elderly ,  Chronically ill appearing man    HEENT: Normal NECK: No JVD; No carotid bruits LYMPHATICS: No lymphadenopathy CARDIAC:  RR  RESPIRATORY:   Rales in the bases  ABDOMEN: Soft, non-tender, non-distended MUSCULOSKELETAL:  Trace ankle  edema  SKIN: Warm and dry NEUROLOGIC:  Alert and oriented x 3 PSYCHIATRIC:  Normal affect   ASSESSMENT:    1. Acute on chronic combined systolic and diastolic CHF (congestive heart failure) (HCC)    PLAN:      1. 1.  Congestive heart failure: Patient had an echo last year which revealed an EF of 50 to 55%.  He is very inactive partly because of his severe dementia and other medical illnesses.  He has been on Lasix twice a week.  The daughter recent increased him to 5 times a week but this did not really result in any additional urine output.  Will try torsemide 20 mg a day along with potassium chloride 10 mg a day.  I have advised the daughter to call us if he does not put out significant urine and we will increase the torsemide to 40 mg a day.  She fixes them fairly low-salt meals. Will reduce his amlodipine to 5 mg a day ( which might help lessen the leg edema )  He spends lots of the day with his legs in a  dependent position.  I have given him directions on getting a lounge doctor leg rest which might help with some of this leg edema.  Because of his dementia his daughter-in-law has found it difficult to keep his legs elevated.  Check a basic metabolic profile today and again in 3 weeks.  He will follow-up with Dr. Angelena Form in 3 months.  2.  Progressive dementia: The patient has started falling.  We discussed the importance of keeping him safe.  The family is going to great efforts to try to keep him at home and keep him safe.  We discussed the fact that at some point he may need to be considered for a long-term memory unit/skilled nursing facility.  Charlene agrees.  They have discussed this many times.  3.  Hypertension: His blood pressure is fairly well controlled today.  We will reduce his amlodipine from 10 mg a day to 5 mg a day.  This might help with some of the edema.  He eats a fairly low-salt diet.      Medication Adjustments/Labs and Tests Ordered: Current medicines are reviewed at length with the patient today.  Concerns regarding medicines are outlined above.  Orders Placed This Encounter  Procedures  . Basic metabolic panel  . Basic metabolic panel  . EKG 12-Lead   Meds ordered this encounter  Medications  . amLODipine (NORVASC) 5 MG tablet    Sig: Take 1 tablet (5 mg total) by mouth daily.    Dispense:  90 tablet    Refill:  3  . torsemide (DEMADEX) 20 MG tablet    Sig: Take 1 tablet (20 mg total) by mouth daily.  Dispense:  90 tablet    Refill:  3  . potassium chloride (KLOR-CON) 10 MEQ tablet    Sig: Take 1 tablet (10 mEq total) by mouth daily.    Dispense:  90 tablet    Refill:  3    Patient Instructions    For your  leg edema you  should do  the following 1. Leg elevation - I recommend the Lounge Dr. Leg rest.  See below for details  2. Salt restriction  -  Use potassium chloride instead of regular salt as a salt substitute. 3. Walk regularly 4.  Compression hose - [facility] [employer] 5. Weight loss    Available on [email or web address] Or  Go to [email or web address]     Medication Instructions:  Your physician has recommended you make the following change in your medication:   STOP Lasix DECREASE Amlodipine to 5mg  daily  START Demadex 20mg  daily INCREASE Potassium to 10 mEq daily   *If you need a refill on your cardiac medications before your next appointment, please call your pharmacy*   Lab Work: TODAY: BMET  Your physician recommends that you return for lab work in: 3 weeks for a BMET If you have labs (blood work) drawn today and your tests are completely normal, you will receive your results only by: Marland Kitchen MyChart Message (if you have MyChart) OR . A paper copy in the mail If you have any lab test that is abnormal or we need to change your treatment, we will call you to review the results.   Testing/Procedures: none   Follow-Up: At [facility], you and your health needs are our priority.  As part of our continuing mission to provide you with exceptional heart care, we have created designated Provider Care Teams.  These Care Teams include your primary Cardiologist (physician) and Advanced Practice Providers (APPs -  Physician Assistants and Nurse Practitioners) who all work together to provide you with the care you need, when you need it.   Your next appointment:   3 month(s)  The format for your next appointment:   In Person  Provider:   You may see Lauree Chandler, MD or one of the following Advanced Practice Providers on your designated Care Team:    Melina Copa, PA-C  Ermalinda Barrios, PA-C       Signed, Mertie Moores, MD  06/17/2020 11:07 AM    [place]

## 2020-06-16 NOTE — Telephone Encounter (Signed)
Patient's daughter-in-law called to see if there was any availability within next couple of weeks. Informed patient that Dr. Angelena Form does not have any availability and the APP next availability would be 07/27/20. She was displeased with that and expressed that patient can't wait that long due to swelling of both legs. Not able to explain extent due to patient having advanced dementia and she does not know anything but the swelling. Please call back to discuss

## 2020-06-16 NOTE — Telephone Encounter (Signed)
Spoke with daughter in law and she states pt has bilateral LE swelling.  Denies increased salt intake.  At the end of the day, sock imprint is noted in legs.  SOB has worsened since appt in March.  Pt prescribed Furosemide 40mg  twice weekly.  Daughter states, starting last week, she has been giving pt Furosemide 20mg  5x per week with no improvement.  States BP is fine but didn't have readings.  States in AM, swelling in legs is improved but feet still look the same.  Daughter very concerned and would like pt to be seen.  Scheduled pt to come in tomorrow to see Dr. Acie Fredrickson on his DOD schedule.  Daughter appreciative for call.

## 2020-06-17 ENCOUNTER — Other Ambulatory Visit: Payer: Self-pay

## 2020-06-17 ENCOUNTER — Encounter: Payer: Self-pay | Admitting: Cardiovascular Disease

## 2020-06-17 ENCOUNTER — Ambulatory Visit: Payer: Medicare Other | Admitting: Cardiovascular Disease

## 2020-06-17 VITALS — BP 106/62 | HR 62 | Ht 67.0 in | Wt 182.0 lb

## 2020-06-17 DIAGNOSIS — I5032 Chronic diastolic (congestive) heart failure: Secondary | ICD-10-CM | POA: Diagnosis not present

## 2020-06-17 DIAGNOSIS — I10 Essential (primary) hypertension: Secondary | ICD-10-CM | POA: Diagnosis not present

## 2020-06-17 DIAGNOSIS — I5043 Acute on chronic combined systolic (congestive) and diastolic (congestive) heart failure: Secondary | ICD-10-CM | POA: Diagnosis not present

## 2020-06-17 DIAGNOSIS — F0390 Unspecified dementia without behavioral disturbance: Secondary | ICD-10-CM | POA: Diagnosis not present

## 2020-06-17 LAB — BASIC METABOLIC PANEL
BUN/Creatinine Ratio: 10 (ref 10–24)
BUN: 13 mg/dL (ref 8–27)
CO2: 25 mmol/L (ref 20–29)
Calcium: 9.4 mg/dL (ref 8.6–10.2)
Chloride: 104 mmol/L (ref 96–106)
Creatinine, Ser: 1.25 mg/dL (ref 0.76–1.27)
Glucose: 106 mg/dL — ABNORMAL HIGH (ref 65–99)
Potassium: 3.7 mmol/L (ref 3.5–5.2)
Sodium: 142 mmol/L (ref 134–144)
eGFR: 59 mL/min/{1.73_m2} — ABNORMAL LOW (ref >59)

## 2020-06-17 MED ORDER — POTASSIUM CHLORIDE ER 10 MEQ PO TBCR: 10.0000 meq | EXTENDED_RELEASE_TABLET | Freq: Every day | ORAL | 3 refills | Status: AC

## 2020-06-17 MED ORDER — TORSEMIDE 20 MG PO TABS: 20.0000 mg | ORAL_TABLET | Freq: Every day | ORAL | 3 refills | Status: AC

## 2020-06-17 MED ORDER — AMLODIPINE BESYLATE 5 MG PO TABS: 5.0000 mg | ORAL_TABLET | Freq: Every day | ORAL | 3 refills | Status: AC

## 2020-06-17 NOTE — Patient Instructions (Addendum)
   For your  leg edema you  should do  the following 1. Leg elevation - I recommend the Lounge Dr. Leg rest.  See below for details  2. Salt restriction  -  Use potassium chloride instead of regular salt as a salt substitute. 3. Walk regularly 4. Compression hose - [facility] [employer] 5. Weight loss    Available on [email or web address] Or  Go to [email or web address]     Medication Instructions:  Your physician has recommended you make the following change in your medication:   STOP Lasix DECREASE Amlodipine to 5mg  daily  START Demadex 20mg  daily INCREASE Potassium to 10 mEq daily   *If you need a refill on your cardiac medications before your next appointment, please call your pharmacy*   Lab Work: TODAY: BMET  Your physician recommends that you return for lab work in: 3 weeks for a BMET If you have labs (blood work) drawn today and your tests are completely normal, you will receive your results only by: Marland Kitchen MyChart Message (if you have MyChart) OR . A paper copy in the mail If you have any lab test that is abnormal or we need to change your treatment, we will call you to review the results.   Testing/Procedures: none   Follow-Up: At [facility], you and your health needs are our priority.  As part of our continuing mission to provide you with exceptional heart care, we have created designated Provider Care Teams.  These Care Teams include your primary Cardiologist (physician) and Advanced Practice Providers (APPs -  Physician Assistants and Nurse Practitioners) who all work together to provide you with the care you need, when you need it.   Your next appointment:   3 month(s)  The format for your next appointment:   In Person  Provider:   You may see Lauree Chandler, MD or one of the following Advanced Practice Providers on your designated Care Team:    Melina Copa, PA-C  Ermalinda Barrios, PA-C

## 2020-06-18 ENCOUNTER — Other Ambulatory Visit: Payer: Self-pay

## 2020-06-18 ENCOUNTER — Ambulatory Visit (HOSPITAL_COMMUNITY)
Admission: EM | Admit: 2020-06-18 | Discharge: 2020-06-18 | Disposition: A | Discharge status: Other Acute Inpatient Hospital | Payer: Medicare Other | Attending: Urgent Care | Admitting: Urgent Care

## 2020-06-18 ENCOUNTER — Encounter (HOSPITAL_COMMUNITY): Payer: Self-pay | Admitting: *Deleted

## 2020-06-18 ENCOUNTER — Ambulatory Visit (INDEPENDENT_AMBULATORY_CARE_PROVIDER_SITE_OTHER): Payer: Medicare Other

## 2020-06-18 DIAGNOSIS — I2510 Atherosclerotic heart disease of native coronary artery without angina pectoris: Secondary | ICD-10-CM | POA: Diagnosis not present

## 2020-06-18 DIAGNOSIS — E7800 Pure hypercholesterolemia, unspecified: Secondary | ICD-10-CM | POA: Diagnosis not present

## 2020-06-18 DIAGNOSIS — R531 Weakness: Secondary | ICD-10-CM

## 2020-06-18 DIAGNOSIS — R059 Cough, unspecified: Secondary | ICD-10-CM

## 2020-06-18 DIAGNOSIS — I509 Heart failure, unspecified: Secondary | ICD-10-CM | POA: Diagnosis not present

## 2020-06-18 DIAGNOSIS — E1165 Type 2 diabetes mellitus with hyperglycemia: Secondary | ICD-10-CM | POA: Diagnosis not present

## 2020-06-18 DIAGNOSIS — R5383 Other fatigue: Secondary | ICD-10-CM

## 2020-06-18 DIAGNOSIS — R0981 Nasal congestion: Secondary | ICD-10-CM

## 2020-06-18 DIAGNOSIS — I4891 Unspecified atrial fibrillation: Secondary | ICD-10-CM | POA: Diagnosis not present

## 2020-06-18 NOTE — ED Provider Notes (Signed)
[place]   MRN: [id number] DOB: 03-11-1940  Subjective:   Tony Chapman is a 80 y.o. male presenting for 2 to 3-day history of acute onset weakness, lethargy, unsteadiness.  Patient has a history of heart failure, dementia, CAD, STEMI, DVT, dementia.  He did have a visit with his cardiologist yesterday as his family was concerned he may be heart related.  Refer to notes from yesterday's office visit for more detail.  A BNP level was not obtained and her daughter presents today for concerns about pneumonia, heart failure.  Reports that typically he ambulates without any assistance but has needed a lot of help and has not been able to be left alone.Patient is not a smoker, no alcohol use either. Last echo showed EF of 50-55%.  Denies fever, chest pain, shortness of breath.  Patient's daughter reports that typically in the morning he can be a little difficult to arouse as he takes a sedative at night.   No current facility-administered medications for this encounter.  Current Outpatient Medications:  .  amLODipine (NORVASC) 5 MG tablet, Take 1 tablet (5 mg total) by mouth daily., Disp: 90 tablet, Rfl: 3 .  aspirin EC 81 MG tablet, Take 81 mg by mouth daily. Swallow whole., Disp: , Rfl:  .  atorvastatin (LIPITOR) 80 MG tablet, Take 80 mg by mouth daily., Disp: , Rfl:  .  benazepril (LOTENSIN) 20 MG tablet, Take 20 mg by mouth daily., Disp: , Rfl:  .  diclofenac Sodium (VOLTAREN) 1 % GEL, Apply topically 4 (four) times daily., Disp: , Rfl:  .  diphenhydrAMINE (BENADRYL) 25 MG tablet, Take 25 mg by mouth every 6 (six) hours as needed for sleep., Disp: , Rfl:  .  donepezil (ARICEPT) 10 MG tablet, Take 10 mg by mouth at bedtime., Disp: , Rfl:  .  isosorbide mononitrate (IMDUR) 30 MG 24 hr tablet, Take 30 mg by mouth daily., Disp: , Rfl:  .  levothyroxine (SYNTHROID) 100 MCG tablet, Take 100 mcg by mouth daily before breakfast., Disp: , Rfl:  .  memantine (NAMENDA) 5 MG tablet,  Take 1 tablet (5 mg total) by mouth 2 (two) times daily., Disp: 180 tablet, Rfl: 3 .  nitroGLYCERIN (NITROSTAT) 0.4 MG SL tablet, PLACE 1 TABLET (0.4 MG TOTAL) UNDER THE TONGUE EVERY FIVE MINUTES AS NEEDED., Disp: 25 tablet, Rfl: 2 .  potassium chloride (KLOR-CON) 10 MEQ tablet, Take 10 mEq by mouth See admin instructions. Take two times per week by mouth, Disp: , Rfl:  .  potassium chloride (KLOR-CON) 10 MEQ tablet, Take 1 tablet (10 mEq total) by mouth daily., Disp: 90 tablet, Rfl: 3 .  risperiDONE (RISPERDAL) 0.5 MG tablet, Take 1 and 1/2 tablets every other night, Disp: 70 tablet, Rfl: 3 .  ticagrelor (BRILINTA) 90 MG TABS tablet, Take 1 tablet (90 mg total) by mouth 2 (two) times daily., Disp: 180 tablet, Rfl: 2 .  torsemide (DEMADEX) 20 MG tablet, Take 1 tablet (20 mg total) by mouth daily., Disp: 90 tablet, Rfl: 3   No Known Allergies  Past Medical History:  Diagnosis Date  . CAD (coronary artery disease)   . Dementia ([place])   . DVT (deep venous thrombosis) ([place])   . HLD (hyperlipidemia)   . HTN (hypertension)   . Hypothyroid   . Lumbar spondylosis   . Prostate cancer [facility])      Past Surgical History:  Procedure Laterality Date  . BACK SURGERY    . CORONARY/GRAFT  ACUTE MI REVASCULARIZATION N/A 12/11/2019   Procedure: Coronary/Graft Acute MI Revascularization;  Surgeon: Burnell Blanks, MD;  Location: [place] [facility];  Service: Cardiovascular;  Laterality: N/A;  . LEFT HEART CATH AND CORONARY ANGIOGRAPHY N/A 12/11/2019   Procedure: LEFT HEART CATH AND CORONARY ANGIOGRAPHY;  Surgeon: Burnell Blanks, MD;  Location: [place] [facility];  Service: Cardiovascular;  Laterality: N/A;    History reviewed. No pertinent family history.  Social History   Tobacco Use  . Smoking status: Never Smoker  . Smokeless tobacco: Never Used  Vaping Use  . Vaping Use: Never used  Substance Use Topics  . Alcohol use: Not Currently  . Drug use: Never    ROS   Objective:    Vitals: BP (!) 106/56 (BP Location: Right Arm)   Pulse 64   Temp 99.2 F (37.3 C) (Oral)   Resp 18   SpO2 99%   Physical Exam Constitutional:      General: He is not in acute distress.    Appearance: He is well-developed. He is not ill-appearing, toxic-appearing or diaphoretic.  HENT:     Head: Normocephalic and atraumatic.     Right Ear: External ear normal.     Left Ear: External ear normal.     Nose: Nose normal.     Mouth/Throat:     Mouth: Mucous membranes are moist.     Pharynx: Oropharynx is clear.  Cardiovascular:     Rate and Rhythm: Normal rate and regular rhythm.     Heart sounds: Normal heart sounds. No murmur heard. No friction rub. No gallop.   Pulmonary:     Effort: Pulmonary effort is normal. No respiratory distress.     Breath sounds: No stridor. No wheezing, rhonchi or rales.     Comments: Slightly decreased breath sounds. Chest:     Chest wall: No tenderness.  Neurological:     Motor: Weakness present.     Comments: Minimally responsive to his name, appears to be heavily sedated.  Psychiatric:        Mood and Affect: Mood normal.        Behavior: Behavior normal.        Thought Content: Thought content normal.        Judgment: Judgment normal.    DG Chest 2 View  Result Date: 06/18/2020 CLINICAL DATA:  Cough and weakness. EXAM: CHEST - 2 VIEW COMPARISON:  10/29/2006 FINDINGS: Heart size is prominent but may be accentuated by technique. Subtle densities at the left lung base may represent atelectasis. Atherosclerotic calcifications at the aortic arch. No large pleural effusions. Negative for a pneumothorax. IMPRESSION: 1. Heart is prominent for size but may be related to technique. 2. Subtle densities at left lung base may represent atelectasis. Overall, no significant lung disease. Electronically Signed   By: Markus Daft M.D.   On: 06/18/2020 12:11    Assessment and Plan :   PDMP not reviewed this encounter.  1. Weakness   2. Cough   3. Nasal  congestion   4. Lethargy   5. Chronic congestive heart failure, unspecified heart failure type ([place])   6. Coronary artery disease involving native heart without angina pectoris, unspecified vessel or lesion type     Patient has undifferentiated acute onset weakness, lethargy, unsteadiness and has significant increase in his risk for falls this past week.  He was cleared by his cardiologist yesterday.  Today, his chest x-ray is unremarkable, vital signs stable.  Physical exam findings  concerning for being very lethargic, nearly unresponsive.  Discussed this at length with his daughter-in-law who is agreeable to take him to the emergency room for further evaluation including imaging and blood work.  Contracts for safety and will take him there now by personal vehicle.   Jaynee Eagles, PA-C 06/18/20 1234

## 2020-06-18 NOTE — Discharge Instructions (Addendum)
Please take your father-in-law to the emergency room now. He needs further work up including imaging of his brain for his new weakness, unsteadiness, lethargy.

## 2020-06-18 NOTE — ED Triage Notes (Signed)
CG reports Pt is weaker and has not been as active . Base line Pt gets up unassisted and does not use Wc

## 2020-06-23 DIAGNOSIS — I4891 Unspecified atrial fibrillation: Secondary | ICD-10-CM | POA: Diagnosis not present

## 2020-06-23 DIAGNOSIS — Z299 Encounter for prophylactic measures, unspecified: Secondary | ICD-10-CM | POA: Diagnosis not present

## 2020-06-23 DIAGNOSIS — I25119 Atherosclerotic heart disease of native coronary artery with unspecified angina pectoris: Secondary | ICD-10-CM | POA: Diagnosis not present

## 2020-06-28 DIAGNOSIS — M1710 Unilateral primary osteoarthritis, unspecified knee: Secondary | ICD-10-CM | POA: Diagnosis not present

## 2020-07-06 ENCOUNTER — Other Ambulatory Visit: Payer: Medicare Other | Admitting: *Deleted

## 2020-07-06 ENCOUNTER — Other Ambulatory Visit: Payer: Self-pay

## 2020-07-06 DIAGNOSIS — I5043 Acute on chronic combined systolic (congestive) and diastolic (congestive) heart failure: Secondary | ICD-10-CM | POA: Diagnosis not present

## 2020-07-07 LAB — BASIC METABOLIC PANEL
BUN/Creatinine Ratio: 9 — ABNORMAL LOW (ref 10–24)
BUN: 11 mg/dL (ref 8–27)
CO2: 26 mmol/L (ref 20–29)
Calcium: 10.1 mg/dL (ref 8.6–10.2)
Chloride: 103 mmol/L (ref 96–106)
Creatinine, Ser: 1.2 mg/dL (ref 0.76–1.27)
Glucose: 112 mg/dL — ABNORMAL HIGH (ref 65–99)
Potassium: 3.7 mmol/L (ref 3.5–5.2)
Sodium: 143 mmol/L (ref 134–144)
eGFR: 62 mL/min/{1.73_m2} (ref >59)

## 2020-07-21 DIAGNOSIS — R310 Gross hematuria: Secondary | ICD-10-CM | POA: Diagnosis not present

## 2020-07-28 DIAGNOSIS — K862 Cyst of pancreas: Secondary | ICD-10-CM | POA: Diagnosis not present

## 2020-07-28 DIAGNOSIS — R310 Gross hematuria: Secondary | ICD-10-CM | POA: Diagnosis not present

## 2020-07-28 DIAGNOSIS — I712 Thoracic aortic aneurysm, without rupture: Secondary | ICD-10-CM | POA: Diagnosis not present

## 2020-07-28 DIAGNOSIS — I700 Atherosclerosis of aorta: Secondary | ICD-10-CM | POA: Diagnosis not present

## 2020-07-29 DIAGNOSIS — M1710 Unilateral primary osteoarthritis, unspecified knee: Secondary | ICD-10-CM | POA: Diagnosis not present

## 2020-08-02 ENCOUNTER — Encounter: Payer: Self-pay | Admitting: Urology

## 2020-08-09 ENCOUNTER — Observation Stay (HOSPITAL_COMMUNITY)
Admission: EM | Admit: 2020-08-09 | Discharge: 2020-08-10 | Disposition: A | Discharge status: Home / Self Care | Payer: Medicare Other | Attending: Internal Medicine | Admitting: Internal Medicine

## 2020-08-09 ENCOUNTER — Other Ambulatory Visit: Payer: Self-pay

## 2020-08-09 ENCOUNTER — Emergency Department (HOSPITAL_COMMUNITY): Payer: Medicare Other

## 2020-08-09 DIAGNOSIS — Z7982 Long term (current) use of aspirin: Secondary | ICD-10-CM | POA: Insufficient documentation

## 2020-08-09 DIAGNOSIS — Z9889 Other specified postprocedural states: Secondary | ICD-10-CM | POA: Diagnosis not present

## 2020-08-09 DIAGNOSIS — I214 Non-ST elevation (NSTEMI) myocardial infarction: Principal | ICD-10-CM | POA: Insufficient documentation

## 2020-08-09 DIAGNOSIS — I5032 Chronic diastolic (congestive) heart failure: Secondary | ICD-10-CM | POA: Diagnosis not present

## 2020-08-09 DIAGNOSIS — I2510 Atherosclerotic heart disease of native coronary artery without angina pectoris: Secondary | ICD-10-CM | POA: Insufficient documentation

## 2020-08-09 DIAGNOSIS — E039 Hypothyroidism, unspecified: Secondary | ICD-10-CM | POA: Insufficient documentation

## 2020-08-09 DIAGNOSIS — J323 Chronic sphenoidal sinusitis: Secondary | ICD-10-CM | POA: Diagnosis not present

## 2020-08-09 DIAGNOSIS — F0390 Unspecified dementia without behavioral disturbance: Secondary | ICD-10-CM | POA: Diagnosis not present

## 2020-08-09 DIAGNOSIS — M160 Bilateral primary osteoarthritis of hip: Secondary | ICD-10-CM | POA: Diagnosis not present

## 2020-08-09 DIAGNOSIS — R079 Chest pain, unspecified: Secondary | ICD-10-CM | POA: Diagnosis present

## 2020-08-09 DIAGNOSIS — Z20822 Contact with and (suspected) exposure to covid-19: Secondary | ICD-10-CM | POA: Insufficient documentation

## 2020-08-09 DIAGNOSIS — Z79899 Other long term (current) drug therapy: Secondary | ICD-10-CM | POA: Diagnosis not present

## 2020-08-09 DIAGNOSIS — I110 Hypertensive heart disease with heart failure: Secondary | ICD-10-CM | POA: Diagnosis not present

## 2020-08-09 DIAGNOSIS — R0602 Shortness of breath: Secondary | ICD-10-CM | POA: Diagnosis not present

## 2020-08-09 DIAGNOSIS — S0990XA Unspecified injury of head, initial encounter: Secondary | ICD-10-CM | POA: Diagnosis not present

## 2020-08-09 DIAGNOSIS — G319 Degenerative disease of nervous system, unspecified: Secondary | ICD-10-CM | POA: Diagnosis not present

## 2020-08-09 DIAGNOSIS — Z8546 Personal history of malignant neoplasm of prostate: Secondary | ICD-10-CM | POA: Insufficient documentation

## 2020-08-09 DIAGNOSIS — R296 Repeated falls: Secondary | ICD-10-CM | POA: Diagnosis not present

## 2020-08-09 LAB — COMPREHENSIVE METABOLIC PANEL
ALT: 17 U/L (ref 0–44)
AST: 24 U/L (ref 15–41)
Albumin: 3.7 g/dL (ref 3.5–5.0)
Alkaline Phosphatase: 87 U/L (ref 38–126)
Anion gap: 8 (ref 5–15)
BUN: 11 mg/dL (ref 8–23)
CO2: 26 mmol/L (ref 22–32)
Calcium: 9.4 mg/dL (ref 8.9–10.3)
Chloride: 106 mmol/L (ref 98–111)
Creatinine, Ser: 1.23 mg/dL (ref 0.61–1.24)
GFR, Estimated: >60 mL/min (ref >60)
Glucose, Bld: 103 mg/dL — ABNORMAL HIGH (ref 70–99)
Potassium: 3.6 mmol/L (ref 3.5–5.1)
Sodium: 140 mmol/L (ref 135–145)
Total Bilirubin: 0.8 mg/dL (ref 0.3–1.2)
Total Protein: 6.5 g/dL (ref 6.5–8.1)

## 2020-08-09 LAB — CBC
HCT: 37.8 % — ABNORMAL LOW (ref 39.0–52.0)
Hemoglobin: 12.3 g/dL — ABNORMAL LOW (ref 13.0–17.0)
MCH: 30.8 pg (ref 26.0–34.0)
MCHC: 32.5 g/dL (ref 30.0–36.0)
MCV: 94.5 fL (ref 80.0–100.0)
Platelets: 316 10*3/uL (ref 150–400)
RBC: 4 MIL/uL — ABNORMAL LOW (ref 4.22–5.81)
RDW: 13.2 % (ref 11.5–15.5)
WBC: 9 10*3/uL (ref 4.0–10.5)
nRBC: 0 % (ref 0.0–0.2)

## 2020-08-09 LAB — TROPONIN I (HIGH SENSITIVITY)
Troponin I (High Sensitivity): 353 ng/L (ref <18)
Troponin I (High Sensitivity): 463 ng/L (ref <18)

## 2020-08-09 LAB — MAGNESIUM: Magnesium: 2 mg/dL (ref 1.7–2.4)

## 2020-08-09 MED ORDER — ASPIRIN EC 81 MG PO TBEC
81.0000 mg | DELAYED_RELEASE_TABLET | Freq: Every day | ORAL | Status: DC
  Administered 2020-08-10: 81 mg via ORAL
  Filled 2020-08-09: qty 1

## 2020-08-09 MED ORDER — ATORVASTATIN CALCIUM 40 MG PO TABS
80.0000 mg | ORAL_TABLET | Freq: Every day | ORAL | Status: DC
  Administered 2020-08-10: 80 mg via ORAL
  Filled 2020-08-09: qty 2

## 2020-08-09 MED ORDER — POTASSIUM CHLORIDE CRYS ER 10 MEQ PO TBCR
10.0000 meq | EXTENDED_RELEASE_TABLET | Freq: Every day | ORAL | Status: DC
  Administered 2020-08-10: 10 meq via ORAL
  Filled 2020-08-09: qty 1

## 2020-08-09 MED ORDER — NITROGLYCERIN 0.4 MG SL SUBL: 0.4000 mg | SUBLINGUAL_TABLET | SUBLINGUAL | Status: DC | PRN

## 2020-08-09 MED ORDER — LEVOTHYROXINE SODIUM 100 MCG PO TABS
100.0000 ug | ORAL_TABLET | Freq: Every day | ORAL | Status: DC
  Administered 2020-08-10: 100 ug via ORAL
  Filled 2020-08-09: qty 1

## 2020-08-09 MED ORDER — DONEPEZIL HCL 10 MG PO TABS
10.0000 mg | ORAL_TABLET | Freq: Every day | ORAL | Status: DC
  Administered 2020-08-09: 10 mg via ORAL
  Filled 2020-08-09 (×2): qty 1

## 2020-08-09 MED ORDER — ISOSORBIDE MONONITRATE ER 30 MG PO TB24: 30.0000 mg | ORAL_TABLET | Freq: Every day | ORAL | Status: DC

## 2020-08-09 MED ORDER — HEPARIN (PORCINE) 25000 UT/250ML-% IV SOLN
900.0000 [IU]/h | INTRAVENOUS | Status: DC
  Administered 2020-08-09: 1050 [IU]/h via INTRAVENOUS
  Filled 2020-08-09: qty 250

## 2020-08-09 MED ORDER — HEPARIN BOLUS VIA INFUSION
4000.0000 [IU] | Freq: Once | INTRAVENOUS | Status: AC
  Administered 2020-08-09: 4000 [IU] via INTRAVENOUS
  Filled 2020-08-09: qty 4000

## 2020-08-09 MED ORDER — DIPHENHYDRAMINE HCL 25 MG PO CAPS: 25.0000 mg | ORAL_CAPSULE | ORAL | Status: DC

## 2020-08-09 MED ORDER — ACETAMINOPHEN 325 MG PO TABS: 650.0000 mg | ORAL_TABLET | ORAL | Status: DC | PRN

## 2020-08-09 MED ORDER — TORSEMIDE 20 MG PO TABS: 20.0000 mg | ORAL_TABLET | Freq: Every day | ORAL | Status: DC

## 2020-08-09 MED ORDER — BENAZEPRIL HCL 20 MG PO TABS
20.0000 mg | ORAL_TABLET | Freq: Every day | ORAL | Status: DC
  Administered 2020-08-10: 20 mg via ORAL
  Filled 2020-08-09: qty 1

## 2020-08-09 MED ORDER — AMLODIPINE BESYLATE 5 MG PO TABS
5.0000 mg | ORAL_TABLET | Freq: Every day | ORAL | Status: DC
  Administered 2020-08-10: 5 mg via ORAL
  Filled 2020-08-09: qty 1

## 2020-08-09 MED ORDER — HYDRALAZINE HCL 20 MG/ML IJ SOLN: 5.0000 mg | Freq: Four times a day (QID) | INTRAMUSCULAR | Status: DC | PRN

## 2020-08-09 MED ORDER — ASPIRIN 81 MG PO CHEW
324.0000 mg | CHEWABLE_TABLET | ORAL | Status: AC
Start: 2020-08-09 — End: 2020-08-09
  Administered 2020-08-09: 324 mg via ORAL
  Filled 2020-08-09: qty 4

## 2020-08-09 MED ORDER — ASPIRIN EC 81 MG PO TBEC: 81.0000 mg | DELAYED_RELEASE_TABLET | Freq: Every day | ORAL | Status: DC

## 2020-08-09 MED ORDER — TICAGRELOR 90 MG PO TABS
90.0000 mg | ORAL_TABLET | Freq: Two times a day (BID) | ORAL | Status: DC
  Administered 2020-08-09: 90 mg via ORAL
  Filled 2020-08-09: qty 1

## 2020-08-09 MED ORDER — RISPERIDONE 0.5 MG PO TABS
0.7500 mg | ORAL_TABLET | ORAL | Status: DC
  Filled 2020-08-09: qty 1

## 2020-08-09 MED ORDER — MEMANTINE HCL 5 MG PO TABS
5.0000 mg | ORAL_TABLET | Freq: Two times a day (BID) | ORAL | Status: DC
  Administered 2020-08-09 – 2020-08-10 (×2): 5 mg via ORAL
  Filled 2020-08-09 (×3): qty 1

## 2020-08-09 MED ORDER — ASPIRIN 300 MG RE SUPP: 300.0000 mg | RECTAL | Status: AC

## 2020-08-09 MED ORDER — ONDANSETRON HCL 4 MG/2ML IJ SOLN: 4.0000 mg | Freq: Four times a day (QID) | INTRAMUSCULAR | Status: DC | PRN

## 2020-08-09 NOTE — Progress Notes (Signed)
ANTICOAGULATION CONSULT NOTE - Initial Consult  Pharmacy Consult for heparin Indication: chest pain/ACS  No Known Allergies  Patient Measurements:   Heparin Dosing Weight: 82 kg   Vital Signs: Temp: 97.3 F (36.3 C) (06/21 1841) Temp Source: Oral (06/21 1841) BP: 121/104 (06/21 1841) Pulse Rate: 73 (06/21 1841)  Labs: Recent Labs    08/09/20 1903  HGB 12.3*  HCT 37.8*  PLT 316  CREATININE 1.23  TROPONINIHS 353*    CrCl cannot be calculated (Unknown ideal weight.).   Medical History: Past Medical History:  Diagnosis Date   CAD (coronary artery disease)    Dementia ([place])    DVT (deep venous thrombosis) (HCC)    HLD (hyperlipidemia)    HTN (hypertension)    Hypothyroid    Lumbar spondylosis    Prostate cancer (HCC)     Medications:  (Not in a hospital admission)   Assessment: 10 YOM with chest pain and elevated troponins to start IV heparin for ACS.   H/H low, Plt wnl, SCr wnl  Goal of Therapy:  Heparin level 0.3-0.7 units/ml Monitor platelets by anticoagulation protocol: Yes   Plan:  -Heparin 4000 units IV bolus followed by heparin infusion at 1050 units/hr -F/u 8 hr HL -Monitor daily HL, CBC and s/s of bleeding  Albertina Parr, PharmD., BCPS, BCCCP Clinical Pharmacist Please refer to [facility] for unit-specific pharmacist

## 2020-08-09 NOTE — ED Triage Notes (Signed)
Pt with hx of dementia arrives with family for eval of episode of shortness of breath, diaphoresis, chest pain, and weakness today. Family reports he has had four episodes of this since 1400 today. Was given 2 nitro. Family thinks he is not in pain any longer, although pt non-communicative. Family sts has limited mobility at baseline but is more off balance than normal today. [place] four times last week. Has appointment with PCP for same Friday.

## 2020-08-09 NOTE — ED Provider Notes (Signed)
[facility] EMERGENCY DEPARTMENT Provider Note   CSN: [id number] Arrival date & time: 08/09/20  1812     History Chief Complaint  Patient presents with   Chest Pain   Shortness of Breath   Weakness    Tony Chapman is a 80 y.o. male.  80 year old male with history of dementia who presents after having for episodes of clutching his chest and becoming diaphoretic.  Patient does have a history of MI in the past.  With his last MI, he decided to do medical management.  His daughter did give him nitroglycerin which did seem to help his symptoms temporarily.  No recent cough or congestion or fever.  He is at his neurological baseline at this time.      Past Medical History:  Diagnosis Date   CAD (coronary artery disease)    Dementia ([place])    DVT (deep venous thrombosis) (HCC)    HLD (hyperlipidemia)    HTN (hypertension)    Hypothyroid    Lumbar spondylosis    Prostate cancer [facility])     Patient Active Problem List   Diagnosis Date Noted   Chronic diastolic CHF (congestive heart failure), NYHA class 2 ([place]) 06/17/2020   Hyperlipidemia 12/15/2019   Second degree AV block, Mobitz type I 12/15/2019   Dementia ([place]) 12/15/2019   Hypertension 12/15/2019   Acute ST elevation myocardial infarction (STEMI) of inferior wall ([place]) 12/12/2019    Past Surgical History:  Procedure Laterality Date   BACK SURGERY     CORONARY/GRAFT ACUTE MI REVASCULARIZATION N/A 12/11/2019   Procedure: Coronary/Graft Acute MI Revascularization;  Surgeon: Burnell Blanks, MD;  Location:  CV LAB;  Service: Cardiovascular;  Laterality: N/A;   LEFT HEART CATH AND CORONARY ANGIOGRAPHY N/A 12/11/2019   Procedure: LEFT HEART CATH AND CORONARY ANGIOGRAPHY;  Surgeon: Burnell Blanks, MD;  Location: [place] [facility] LAB;  Service: Cardiovascular;  Laterality: N/A;       No family history on file.  Social History   Tobacco Use   Smoking status: Never   Smokeless  tobacco: Never  Vaping Use   Vaping Use: Never used  Substance Use Topics   Alcohol use: Not Currently   Drug use: Never    Home Medications Prior to Admission medications   Medication Sig Start Date End Date Taking? Authorizing Provider  amLODipine (NORVASC) 5 MG tablet Take 1 tablet (5 mg total) by mouth daily. 06/17/20   Nahser, Wonda Cheng, MD  aspirin EC 81 MG tablet Take 81 mg by mouth daily. Swallow whole.    [provider]  atorvastatin (LIPITOR) 80 MG tablet Take 80 mg by mouth daily.    [provider]  benazepril (LOTENSIN) 20 MG tablet Take 20 mg by mouth daily.    [provider]  diclofenac Sodium (VOLTAREN) 1 % GEL Apply topically 4 (four) times daily.    [provider]  diphenhydrAMINE (BENADRYL) 25 MG tablet Take 25 mg by mouth every 6 (six) hours as needed for sleep.    [provider]  donepezil (ARICEPT) 10 MG tablet Take 10 mg by mouth at bedtime.    [provider]  isosorbide mononitrate (IMDUR) 30 MG 24 hr tablet Take 30 mg by mouth daily.    [provider]  levothyroxine (SYNTHROID) 100 MCG tablet Take 100 mcg by mouth daily before breakfast.    [provider]  memantine (NAMENDA) 5 MG tablet Take 1 tablet (5 mg total) by mouth  2 (two) times daily. 04/07/20   Cameron Sprang, MD  nitroGLYCERIN (NITROSTAT) 0.4 MG SL tablet PLACE 1 TABLET (0.4 MG TOTAL) UNDER THE TONGUE EVERY FIVE MINUTES AS NEEDED. 12/15/19 12/14/20  Reino Bellis B, NP  potassium chloride (KLOR-CON) 10 MEQ tablet Take 10 mEq by mouth See admin instructions. Take two times per week by mouth    [provider]  potassium chloride (KLOR-CON) 10 MEQ tablet Take 1 tablet (10 mEq total) by mouth daily. 06/17/20   Nahser, Wonda Cheng, MD  risperiDONE (RISPERDAL) 0.5 MG tablet Take 1 and 1/2 tablets every other night 04/07/20   Cameron Sprang, MD  ticagrelor (BRILINTA) 90 MG TABS tablet Take 1 tablet (90 mg total) by mouth 2 (two)  times daily. 12/15/19   Cheryln Manly, NP  torsemide (DEMADEX) 20 MG tablet Take 1 tablet (20 mg total) by mouth daily. 06/17/20   Nahser, Wonda Cheng, MD    Allergies    Patient has no known allergies.  Review of Systems   Review of Systems  All other systems reviewed and are negative.  Physical Exam Updated Vital Signs BP (!) 121/104 (BP Location: Right Arm)   Pulse 73   Temp (!) 97.3 F (36.3 C) (Oral)   Resp 18   SpO2 100%   Physical Exam Vitals and nursing note reviewed.  Constitutional:      General: He is not in acute distress.    Appearance: Normal appearance. He is well-developed. He is not toxic-appearing.  HENT:     Head: Normocephalic and atraumatic.  Eyes:     General: Lids are normal.     Conjunctiva/sclera: Conjunctivae normal.     Pupils: Pupils are equal, round, and reactive to light.  Neck:     Thyroid: No thyroid mass.     Trachea: No tracheal deviation.  Cardiovascular:     Rate and Rhythm: Normal rate and regular rhythm.     Heart sounds: Normal heart sounds. No murmur heard.   No gallop.  Pulmonary:     Effort: Pulmonary effort is normal. No respiratory distress.     Breath sounds: Normal breath sounds. No stridor. No decreased breath sounds, wheezing, rhonchi or rales.  Abdominal:     General: There is no distension.     Palpations: Abdomen is soft.     Tenderness: There is no abdominal tenderness. There is no rebound.  Musculoskeletal:        General: No tenderness. Normal range of motion.     Cervical back: Normal range of motion and neck supple.  Skin:    General: Skin is warm and dry.     Findings: No abrasion or rash.  Neurological:     Mental Status: He is alert. Mental status is at baseline. He is confused.     GCS: GCS eye subscore is 4. GCS verbal subscore is 5. GCS motor subscore is 6.     Cranial Nerves: Cranial nerves are intact. No cranial nerve deficit.     Sensory: No sensory deficit.     Motor: Motor function is intact.   Psychiatric:        Attention and Perception: He is inattentive.        Mood and Affect: Affect is flat.    ED Results / Procedures / Treatments   Labs (all labs ordered are listed, but only abnormal results are displayed) Labs Reviewed  CBC - Abnormal; Notable for the following components:  Result Value   RBC 4.00 (*)    Hemoglobin 12.3 (*)    HCT 37.8 (*)    All other components within normal limits  COMPREHENSIVE METABOLIC PANEL - Abnormal; Notable for the following components:   Glucose, Bld 103 (*)    All other components within normal limits  TROPONIN I (HIGH SENSITIVITY) - Abnormal; Notable for the following components:   Troponin I (High Sensitivity) 353 (*)    All other components within normal limits  MAGNESIUM  TROPONIN I (HIGH SENSITIVITY)    EKG EKG Interpretation  Date/Time:  Tuesday August 09 2020 18:45:30 EDT Ventricular Rate:  72 PR Interval:  150 QRS Duration: 82 QT Interval:  410 QTC Calculation: 448 R Axis:   -63 Text Interpretation: Sinus rhythm with occasional Premature ventricular complexes Left axis deviation Possible Anterior infarct , age undetermined Abnormal ECG Confirmed by Lacretia Leigh ([id number]) on 08/09/2020 8:50:38 PM  Radiology DG Chest 2 View  Result Date: 08/09/2020 CLINICAL DATA:  Shortness of breath with multiple falls. EXAM: CHEST - 2 VIEW COMPARISON:  06/18/2020 FINDINGS: Patchy areas of ground-glass opacity are noted in both lungs with a basilar predominance. No pulmonary edema or pleural effusion. Cardiopericardial silhouette is at upper limits of normal for size. The visualized bony structures of the thorax show no acute abnormality. IMPRESSION: Patchy ground-glass opacity in both lungs. Multifocal pneumonia including atypical etiology would be a consideration. Electronically Signed   By: Misty Stanley M.D.   On: 08/09/2020 20:12   DG Pelvis 1-2 Views  Result Date: 08/09/2020 CLINICAL DATA:  History of multiple falls EXAM:  PELVIS - 1 VIEW COMPARISON:  None. FINDINGS: Pelvic ring is intact. Degenerative changes of the hip joints are noted bilaterally. 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 Postsurgical changes in the lower lumbar spine are noted. IMPRESSION: No acute abnormality noted. Electronically Signed   By: Inez Catalina M.D.   On: 08/09/2020 20:13   CT Head Wo Contrast  Result Date: 08/09/2020 CLINICAL DATA:  Head trauma.  On blood thinner. EXAM: CT HEAD WITHOUT CONTRAST TECHNIQUE: Contiguous axial images were obtained from the base of the skull through the vertex without intravenous contrast. COMPARISON:  MRI head 05/04/2020 FINDINGS: Brain: Generalized atrophy. Chronic white matter changes as noted on prior MRI. Negative for acute infarct, hemorrhage, mass Vascular: Negative for hyperdense vessel Skull: Negative Sinuses/Orbits: Mild mucosal edema paranasal sinuses. Air-fluid level right sphenoid sinus. Negative orbit Other: None IMPRESSION: Atrophy and chronic microvascular ischemic change in the white matter. No acute abnormality. Electronically Signed   By: Franchot Gallo M.D.   On: 08/09/2020 19:34    Procedures Procedures   Medications Ordered in ED Medications - No data to display  ED Course  I have reviewed the triage vital signs and the nursing notes.  Pertinent labs & imaging results that were available during my care of the patient were reviewed by me and considered in my medical decision making (see chart for details).    MDM Rules/Calculators/A&P                          Patient with elevated troponin and repeat was even more increased.  EKG without ischemic findings.  Suspect NSTEMI and patient started on heparin.  Discussed with cardiology who feels that patient can be medically managed given that he is not candidate for stent placement.  Chest x-ray results noted and patient has not been febrile.  Have sent COVID test will consult hospitalist for  admission Final Clinical Impression(s) / ED  Diagnoses Final diagnoses:  None    Rx / DC Orders ED Discharge Orders     None        Lacretia Leigh, MD 08/09/20 2212

## 2020-08-09 NOTE — ED Provider Notes (Signed)
Emergency Medicine Provider Triage Evaluation Note  Tony Chapman , a 80 y.o. male  was evaluated in triage.  Pt complains of with advanced dementia who presents because family is reporting multiple episodes this afternoon of him clutching his chest, having tachypnea and shortness of breath as well as becoming diaphoretic.  States patient is anticoagulated with Brilinta and has had multiple falls asleep early medication, has been more weak than his normal for him.  Family at the bedside is very attentive, daughter-in-law, caretaker.  Review of Systems  ROS unable to be performed due to patient's underlying dementia.  Physical Exam  BP (!) 121/104 (BP Location: Right Arm)   Pulse 73   Temp (!) 97.3 F (36.3 C) (Oral)   Resp 18   SpO2 100%  Gen:   Awake, no distress   Resp:  Normal effort  MSK:   Moves extremities without difficulty  Other:  RRR no M/R/G.  Lungs with diminished breath sounds on bases due to poor participation by patient.  Tenderness palpation over the right hip.  Neurologic exam without focal deficit.  Medical Decision Making  Medically screening exam initiated at 7:04 PM.  Appropriate orders placed.  Tony Chapman was informed that the remainder of the evaluation will be completed by another provider, this initial triage assessment does not replace that evaluation, and the importance of remaining in the ED until their evaluation is complete.  This chart was dictated using voice recognition software, Dragon. Despite the best efforts of this provider to proofread and correct errors, errors may still occur which can change documentation meaning.    Aura Dials 08/09/20 1905    Truddie Hidden, MD 08/09/20 2248

## 2020-08-09 NOTE — ED Notes (Signed)
Date and time results received: 08/09/20   Test: IXMD 800 Critical Value: 353 Name of Provider Notified: Karle Starch

## 2020-08-09 NOTE — ED Notes (Signed)
Per daughter in law, patient had an episode of chest pain with sweating and dizziness. Patient offers no complaints at this time. Skin is warm and dry. Cardiac monitor in place, but patient is pulling at cables and lines.

## 2020-08-09 NOTE — H&P (Signed)
History and Physical  Tony Tony Chapman YHC:[id number] DOB: 1940-07-06 DOA: 08/09/2020  Referring physician: Dr. Zenia Resides, EDP  PCP: Glenda Chroman, MD  Outpatient Specialists: Cardiology Tony Chapman coming from: Home.  Chief Complaint: Chest pain   HPI: Tony Tony Chapman is a 80 y.o. male with medical history significant for Coronary artery disease status post PCI to unknown artery at [facility], history of inferior STEMI admitted on 11/2019, underwent cardiac cath but was felt to be a poor candidate for further PCI with advanced dementia (per cardiology's office note, plan was for medical therapy, conservative approach.  Was placed on aspirin, Brilinta, statin, amlodipine, Imdur, no beta-blocker due to second-degree AV block, Mobitz 1), history of DVT, dementia with sundowning, hyperlipidemia, hypertension, chronic diastolic CHF, who presented to [facility] ED from home where he lives with his son and daughter-in-law due to chest pain.  History is limited due to the Tony [name] dementia.  50 of the history is obtained from the Tony [name] daughter in law, Tony Tony Chapman, at bedside.  She reports that this morning while at rest Tony Chapman had complained of chest pain and was diaphoretic.  He had taken his cardiac medications.  EMS was activated.  He was brought into the ED for further evaluation.  While in the ED work-up revealed NSTEMI.  Was started on heparin drip, cardiology was contacted by EDP, no plans for aggressive intervention, recommended IV heparin 48-72 hours.  TRH, hospitalist team was asked to admit.  ED Course:  Temperature 97.3.  BP 160/82, pulse 60, respiratory rate 11, O2 saturation 100% on room air.  Lab studies remarkable for BUN 11, creatinine 1.23, GFR greater than 60.  Troponin 353, 463.  WBC 9.0, hemoglobin 12.3, platelet 316.  Review of Systems: Review of systems as noted in the HPI. All other systems reviewed and are negative.   Past Medical History:  Diagnosis Date   CAD  (coronary artery disease)    Dementia ([place])    DVT (deep venous thrombosis) (HCC)    HLD (hyperlipidemia)    HTN (hypertension)    Hypothyroid    Lumbar spondylosis    Prostate cancer [facility])    Past Surgical History:  Procedure Laterality Date   BACK SURGERY     CORONARY/GRAFT ACUTE MI REVASCULARIZATION N/A 12/11/2019   Procedure: Coronary/Graft Acute MI Revascularization;  Surgeon: Burnell Blanks, MD;  Location: [place] CV LAB;  Service: Cardiovascular;  Laterality: N/A;   LEFT HEART CATH AND CORONARY ANGIOGRAPHY N/A 12/11/2019   Procedure: LEFT HEART CATH AND CORONARY ANGIOGRAPHY;  Surgeon: Burnell Blanks, MD;  Location: [place] CV LAB;  Service: Cardiovascular;  Laterality: N/A;    Social History:  reports that he has never smoked. He has never used smokeless tobacco. He reports previous alcohol use. He reports that he does not use drugs.   No Known Allergies  Family history: Provided by the Tony [name] daughter-in-law, Tony Tony Chapman. 1 brother deceased with bilateral lower extremity amputation 1 brother killed in war in [place].  Prior to Admission medications   Medication Sig Start Date End Date Taking? Authorizing Provider  amLODipine (NORVASC) 5 MG tablet Take 1 tablet (5 mg total) by mouth daily. 06/17/20  Yes Nahser, Wonda Cheng, MD  aspirin EC 81 MG tablet Take 81 mg by mouth daily. Swallow whole.   Yes [provider]  atorvastatin (LIPITOR) 80 MG tablet Take 80 mg by mouth daily.   Yes [provider]  benazepril (LOTENSIN) 20 MG tablet Take 20 mg by mouth  daily.   Yes [provider]  diphenhydrAMINE (BENADRYL) 25 MG tablet Take 25 mg by mouth every other day.   Yes [provider]  donepezil (ARICEPT) 10 MG tablet Take 10 mg by mouth at bedtime.   Yes [provider]  isosorbide mononitrate (IMDUR) 30 MG 24 hr tablet Take 30 mg by mouth daily.   Yes [provider]  levothyroxine (SYNTHROID) 100 MCG  tablet Take 100 mcg by mouth daily before breakfast.   Yes [provider]  memantine (NAMENDA) 5 MG tablet Take 1 tablet (5 mg total) by mouth 2 (two) times daily. 04/07/20  Yes Cameron Sprang, MD  nitroGLYCERIN (NITROSTAT) 0.4 MG SL tablet PLACE 1 TABLET (0.4 MG TOTAL) UNDER THE TONGUE EVERY FIVE MINUTES AS NEEDED. 12/15/19 12/14/20 Yes Cheryln Manly, NP  potassium chloride (KLOR-CON) 10 MEQ tablet Take 1 tablet (10 mEq total) by mouth daily. 06/17/20  Yes Nahser, Wonda Cheng, MD  risperiDONE (RISPERDAL) 0.5 MG tablet Take 1 and 1/2 tablets every other night Tony Chapman taking differently: Take 0.75 mg by mouth every other day. 04/07/20  Yes Cameron Sprang, MD  ticagrelor (BRILINTA) 90 MG TABS tablet Take 1 tablet (90 mg total) by mouth 2 (two) times daily. 12/15/19  Yes Reino Bellis B, NP  torsemide (DEMADEX) 20 MG tablet Take 1 tablet (20 mg total) by mouth daily. 06/17/20  Yes Nahser, Wonda Cheng, MD    Physical Exam: BP (!) 154/95   Pulse (!) 147   Temp (!) 97.3 F (36.3 C) (Oral)   Resp 13   SpO2 97%   General: 80 y.o. year-old male well developed well nourished in no acute distress.  Confused and agitated. Cardiovascular: Regular rate and rhythm with no rubs or gallops.  No thyromegaly or JVD noted.  No lower extremity edema. 2/4 pulses in all 4 extremities. Respiratory: Clear to auscultation with no wheezes or rales.  Poor inspiratory effort. Abdomen: Soft nontender nondistended with normal bowel sounds x4 quadrants. Muskuloskeletal: No cyanosis, clubbing or edema noted bilaterally Neuro: CN II-XII intact, strength, sensation, reflexes Skin: No ulcerative lesions noted or rashes Psychiatry: Judgement and insight appear altered. Mood is anxious.          Labs on Admission:  Basic Metabolic Panel: Recent Labs  Lab 08/09/20 1903  NA 140  K 3.6  CL 106  CO2 26  GLUCOSE 103*  BUN 11  CREATININE 1.23  CALCIUM 9.4  MG 2.0   Liver Function Tests: Recent Labs  Lab  08/09/20 1903  AST 24  ALT 17  ALKPHOS 87  BILITOT 0.8  PROT 6.5  ALBUMIN 3.7   No results for input(s): LIPASE, AMYLASE in the last 168 hours. No results for input(s): AMMONIA in the last 168 hours. CBC: Recent Labs  Lab 08/09/20 1903  WBC 9.0  HGB 12.3*  HCT 37.8*  MCV 94.5  PLT 316   Cardiac Enzymes: No results for input(s): CKTOTAL, CKMB, CKMBINDEX, TROPONINI in the last 168 hours.  BNP (last 3 results) No results for input(s): BNP in the last 8760 hours.  ProBNP (last 3 results) No results for input(s): PROBNP in the last 8760 hours.  CBG: No results for input(s): GLUCAP in the last 168 hours.  Radiological Exams on Admission: DG Chest 2 View  Result Date: 08/09/2020 CLINICAL DATA:  Shortness of breath with multiple falls. EXAM: CHEST - 2 VIEW COMPARISON:  06/18/2020 FINDINGS: Patchy areas of ground-glass opacity are noted in both lungs with a  basilar predominance. No pulmonary edema or pleural effusion. Cardiopericardial silhouette is at upper limits of normal for size. The visualized bony structures of the thorax show no acute abnormality. IMPRESSION: Patchy ground-glass opacity in both lungs. Multifocal pneumonia including atypical etiology would be a consideration. Electronically Signed   By: Misty Stanley M.D.   On: 08/09/2020 20:12   DG Pelvis 1-2 Views  Result Date: 08/09/2020 CLINICAL DATA:  History of multiple falls EXAM: PELVIS - 1 VIEW COMPARISON:  None. FINDINGS: Pelvic ring is intact. Degenerative changes of the hip joints are noted bilaterally. 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 Postsurgical changes in the lower lumbar spine are noted. IMPRESSION: No acute abnormality noted. Electronically Signed   By: Inez Catalina M.D.   On: 08/09/2020 20:13   CT Head Wo Contrast  Result Date: 08/09/2020 CLINICAL DATA:  Head trauma.  On blood thinner. EXAM: CT HEAD WITHOUT CONTRAST TECHNIQUE: Contiguous axial images were obtained from the base of the skull through the vertex  without intravenous contrast. COMPARISON:  MRI head 05/04/2020 FINDINGS: Brain: Generalized atrophy. Chronic white matter changes as noted on prior MRI. Negative for acute infarct, hemorrhage, mass Vascular: Negative for hyperdense vessel Skull: Negative Sinuses/Orbits: Mild mucosal edema paranasal sinuses. Air-fluid level right sphenoid sinus. Negative orbit Other: None IMPRESSION: Atrophy and chronic microvascular ischemic change in the white matter. No acute abnormality. Electronically Signed   By: Franchot Gallo M.D.   On: 08/09/2020 19:34    EKG: I independently viewed the EKG done and my findings are as followed: SR 72, non specific ST T changes.  QTC 448.   Assessment/Plan Present on Admission:  NSTEMI (non-ST elevated myocardial infarction) ([place])  Active Problems:   NSTEMI (non-ST elevated myocardial infarction) ([place])  NSTEMI Presented with chest pain and diaphoresis, history limited due to dementia. Troponin up trending from 353 to 463. Received full dose aspirin in the ED. Started on heparin drip, continue for 48 to 72 hours. 2D echo, fasting lipid panel ordered Cardiology will see in the morning in consultation. Discussed case with cardiology fellow Dr. Hassell Done, will hold off Brilinta while on heparin drip, continue home aspirin and heparin drip for 48 to 72 hours or until troponin trends down.  Coronary artery disease status post PCI Per daughter-in-law report, Tony Chapman had a stent placed in 2012 or 2014. Was placed on aspirin and Brilinta since October 2021 when Tony Chapman had a heart cath, no stents were placed. Not a candidate for further PCI due to his advanced dementia. Restart dual antiplatelet when okay with cardiology. Cardiology will see in consultation on 08/11/2020 Resume home regimen, hold off Brilinta for now while on heparin drip.  Uncontrolled Hypertension BP is not at goal Resume home Imdur, Norvasc, benazepril, torsemide IV hydralazine as needed with  parameters. Closely monitor vital signs  Chronic normocytic anemia Stable Hg 12.3, MCV 94 Monitor H&H  Chronic diastolic CHF Last 2 D echo done 12/12/19 revealed normal LVEF 50-55% w rWMA Strict I's and O's and daily weight Resume home cardiac medications as stated above.  Hypothyroidism Resume home levothyroxine  Dementia with behavioral disturbances and history of sundowning Resume home regimen Tony Chapman agitated at the time of this visit pulling at his IV One-to-one sitter ordered for Tony [name] own safety Fall precautions/aspiration/delirium precautions  Ambulatory dysfunction Continue fall precautions PT OT eval TOC consulted to assist with DC planning  Goals of care Palliative care team consult to assist with establishing goals of care.    DVT prophylaxis: Heparin drip 48 to 72  hours from 08/09/2020  Code Status: DNR as stated by his daughter-in-law at bedside.  Family Communication: Daughter-in-law at bedside, Tony Tony Chapman.  Disposition Plan: Admit to progressive unit.  Consults called: Cardiology.  Admission status: Inpatient status.  Tony Chapman will require at least 2 midnights for further evaluation and treatment of present condition.   Status is: Inpatient   Dispo:  Tony Chapman From: Home  Planned Disposition: Home with home health services likely on 08/12/2020 or when cardiology signs off.  Medically stable for discharge: No         Kayleen Memos MD [facility] Pager [phone]  If 7PM-7AM, please contact night-coverage [email or web address] Password [facility]  08/09/2020, 10:31 PM

## 2020-08-10 ENCOUNTER — Encounter (HOSPITAL_COMMUNITY): Payer: Self-pay | Admitting: Internal Medicine

## 2020-08-10 ENCOUNTER — Other Ambulatory Visit (HOSPITAL_COMMUNITY): Payer: Self-pay

## 2020-08-10 ENCOUNTER — Inpatient Hospital Stay (HOSPITAL_BASED_OUTPATIENT_CLINIC_OR_DEPARTMENT_OTHER): Payer: Medicare Other

## 2020-08-10 DIAGNOSIS — I361 Nonrheumatic tricuspid (valve) insufficiency: Secondary | ICD-10-CM

## 2020-08-10 DIAGNOSIS — Z7189 Other specified counseling: Secondary | ICD-10-CM

## 2020-08-10 DIAGNOSIS — I5032 Chronic diastolic (congestive) heart failure: Secondary | ICD-10-CM | POA: Diagnosis not present

## 2020-08-10 DIAGNOSIS — I509 Heart failure, unspecified: Secondary | ICD-10-CM

## 2020-08-10 DIAGNOSIS — I214 Non-ST elevation (NSTEMI) myocardial infarction: Secondary | ICD-10-CM | POA: Diagnosis not present

## 2020-08-10 DIAGNOSIS — R079 Chest pain, unspecified: Secondary | ICD-10-CM

## 2020-08-10 DIAGNOSIS — I10 Essential (primary) hypertension: Secondary | ICD-10-CM

## 2020-08-10 DIAGNOSIS — F0281 Dementia in other diseases classified elsewhere with behavioral disturbance: Secondary | ICD-10-CM

## 2020-08-10 DIAGNOSIS — R262 Difficulty in walking, not elsewhere classified: Secondary | ICD-10-CM | POA: Diagnosis not present

## 2020-08-10 DIAGNOSIS — G20 Parkinson's disease: Secondary | ICD-10-CM

## 2020-08-10 DIAGNOSIS — I25110 Atherosclerotic heart disease of native coronary artery with unstable angina pectoris: Secondary | ICD-10-CM

## 2020-08-10 DIAGNOSIS — Z515 Encounter for palliative care: Secondary | ICD-10-CM

## 2020-08-10 LAB — PHOSPHORUS: Phosphorus: 2.7 mg/dL (ref 2.5–4.6)

## 2020-08-10 LAB — MAGNESIUM: Magnesium: 1.9 mg/dL (ref 1.7–2.4)

## 2020-08-10 LAB — CBC
HCT: 34.1 % — ABNORMAL LOW (ref 39.0–52.0)
Hemoglobin: 11.3 g/dL — ABNORMAL LOW (ref 13.0–17.0)
MCH: 31.2 pg (ref 26.0–34.0)
MCHC: 33.1 g/dL (ref 30.0–36.0)
MCV: 94.2 fL (ref 80.0–100.0)
Platelets: 274 10*3/uL (ref 150–400)
RBC: 3.62 MIL/uL — ABNORMAL LOW (ref 4.22–5.81)
RDW: 13.4 % (ref 11.5–15.5)
WBC: 8 10*3/uL (ref 4.0–10.5)
nRBC: 0 % (ref 0.0–0.2)

## 2020-08-10 LAB — TROPONIN I (HIGH SENSITIVITY)
Troponin I (High Sensitivity): 433 ng/L (ref <18)
Troponin I (High Sensitivity): 452 ng/L (ref <18)

## 2020-08-10 LAB — LIPID PANEL
Cholesterol: 122 mg/dL (ref 0–200)
HDL: 40 mg/dL — ABNORMAL LOW (ref >40)
LDL Cholesterol: 74 mg/dL (ref 0–99)
Total CHOL/HDL Ratio: 3.1 RATIO
Triglycerides: 41 mg/dL (ref <150)
VLDL: 8 mg/dL (ref 0–40)

## 2020-08-10 LAB — ECHOCARDIOGRAM COMPLETE
Area-P 1/2: 3.08 cm2
S' Lateral: 3.1 cm

## 2020-08-10 LAB — PROTIME-INR
INR: 1.2 (ref 0.8–1.2)
Prothrombin Time: 15.2 seconds (ref 11.4–15.2)

## 2020-08-10 LAB — HEPARIN LEVEL (UNFRACTIONATED): Heparin Unfractionated: 0.71 IU/mL — ABNORMAL HIGH (ref 0.30–0.70)

## 2020-08-10 LAB — RESP PANEL BY RT-PCR (FLU A&B, COVID) ARPGX2
Influenza A by PCR: NEGATIVE
Influenza B by PCR: NEGATIVE
SARS Coronavirus 2 by RT PCR: NEGATIVE

## 2020-08-10 MED ORDER — LORAZEPAM 2 MG/ML PO CONC
0.5000 mg | Freq: Four times a day (QID) | ORAL | 0 refills | Status: DC | PRN
  Filled 2020-08-10: qty 30, 25d supply, fill #0

## 2020-08-10 MED ORDER — MORPHINE SULFATE (CONCENTRATE) 10 MG/0.5ML PO SOLN: 5.0000 mg | ORAL | Status: DC | PRN

## 2020-08-10 MED ORDER — PERFLUTREN LIPID MICROSPHERE
1.0000 mL | INTRAVENOUS | Status: AC | PRN
  Administered 2020-08-10: 2 mL via INTRAVENOUS
  Filled 2020-08-10: qty 10

## 2020-08-10 MED ORDER — DIPHENHYDRAMINE HCL 25 MG PO CAPS
25.0000 mg | ORAL_CAPSULE | ORAL | Status: DC
  Administered 2020-08-10: 25 mg via ORAL
  Filled 2020-08-10: qty 1

## 2020-08-10 MED ORDER — ISOSORBIDE MONONITRATE ER 30 MG PO TB24
30.0000 mg | ORAL_TABLET | Freq: Once | ORAL | Status: AC
  Administered 2020-08-10: 30 mg via ORAL
  Filled 2020-08-10: qty 1

## 2020-08-10 MED ORDER — TICAGRELOR 90 MG PO TABS
90.0000 mg | ORAL_TABLET | Freq: Two times a day (BID) | ORAL | Status: DC
  Administered 2020-08-10: 90 mg via ORAL
  Filled 2020-08-10: qty 1

## 2020-08-10 MED ORDER — RISPERIDONE 0.5 MG PO TABS: 0.7500 mg | ORAL_TABLET | ORAL | Status: AC

## 2020-08-10 MED ORDER — ISOSORBIDE MONONITRATE ER 30 MG PO TB24: 60.0000 mg | ORAL_TABLET | Freq: Every day | ORAL | Status: DC

## 2020-08-10 MED ORDER — LORAZEPAM 2 MG/ML IJ SOLN
0.5000 mg | Freq: Once | INTRAMUSCULAR | Status: AC
  Administered 2020-08-10: 0.5 mg via INTRAVENOUS
  Filled 2020-08-10: qty 1

## 2020-08-10 MED ORDER — MORPHINE SULFATE (CONCENTRATE) 20 MG/ML PO SOLN
5.0000 mg | ORAL | 0 refills | Status: AC | PRN
  Filled 2020-08-10: qty 30, 5d supply, fill #0

## 2020-08-10 MED ORDER — ISOSORBIDE MONONITRATE ER 60 MG PO TB24
60.0000 mg | ORAL_TABLET | Freq: Every day | ORAL | 0 refills | Status: AC
  Filled 2020-08-10: qty 30, 30d supply, fill #0

## 2020-08-10 MED ORDER — LORAZEPAM 2 MG/ML PO CONC: 0.5000 mg | Freq: Four times a day (QID) | ORAL | Status: DC | PRN

## 2020-08-10 MED ORDER — LORAZEPAM 0.5 MG PO TABS
0.5000 mg | ORAL_TABLET | Freq: Two times a day (BID) | ORAL | 0 refills | Status: AC
  Filled 2020-08-10: qty 60, 30d supply, fill #0

## 2020-08-10 NOTE — Consult Note (Signed)
Consultation Note Date: 08/10/2020   Patient Name: Tony Chapman  DOB: 1940-12-10  MRN: [id number]  Age / Sex: 80 y.o., male  PCP: Glenda Chroman, MD Referring Physician: Cristal Ford, DO  Reason for Consultation: Establishing goals of care, Non pain symptom management, Pain control, and to discuss complex medical decision making with family.  HPI/Patient Profile: 80 y.o. male  with past medical history of worsening dementia, significant CAD post PCI, DVT, HLD, HTN, Hypothyroid, and prostate cancer who was admitted on 08/09/2020 with chest pain and diaphoresis. Patient was found to be having an NSTEMI and was started on a heparin drip. Patient has a significant hisotry of inferior STEMI admitted on 11/2019, underwent cardiac catheterization, but was found to not be a good candidate for further PCI due to advanced dementia. Patient's STEMI was conservatively managed th aspirin, Brilinta, statin, amlodipine, and Imdur. No plans for aggressive treatment/intervention for current NSTEMI admission. Palliative care was consulted to assist with goals of care.   Clinical Assessment and Goals of Care: I have reviewed medical records including EPIC notes, labs and imaging, received report from RN, assessed the patient and then met at the bedside along with his cardiologist, daughter-in-law, Randell Patient, and Chauncey Reading, Ethel Rana, via phone call to discuss diagnosis prognosis, [place], EOL wishes, disposition and options.  I introduced Palliative Medicine as specialized medical care for people living with serious illness. It focuses on providing relief from the symptoms and stress of a serious illness. The goal is to improve quality of life for both the patient and the family.   We discussed a brief life review of the patient and then focused on their current illness. The natural disease trajectory and expectations at EOL were discussed.  Per daughter-in-law, Randell Patient, Mr. Rittenhouse "Tony Chapman" has "worn many hats." He previously worked as a Librarian, academic for [employer]; however, he was also a Company secretary, a Art gallery manager, and was an avid Airline pilot. He takes pride in his fitness, and he is a lovers of cars. He owns 2 classic mustangs. Randell Patient states that even with his advancing dementia, he love to talk about his cars. She states he also loves to listen to  gospel music; "Peace be Still" is his favorite song and helps to calm him down. Charlene then went on to summarize [place] most recent medical history. Since his last STEMI in October 2021, she has noticed his dementia has worsened. Prior to the STEMI, Tony Chapman was about to move around with minimal assistance; however, now, Tony Chapman requires 24 hour assistance with ambulation and ADLs. More recently, Charlene describes that Tony Chapman has been craving softer foods now when hungry. Randell Patient states she is his primary caregiver, but taking care of him has become increasingly difficult since she works full-time and has 6 kids and 2 grandchildren. She states any help that could be offered would be beneficial.   I attempted to elicit values and goals of care important to the patient.   The difference between aggressive medical intervention and comfort care was considered in light of  the patient's goals of care. Vicente Males and [place] are in agreement with the recommendations that home with hospice would be best for Tony Chapman due to his declining health and likelihood of more cardiac events. Tony and Charelene express they would prefer Tony Chapman to be at home rather than be transported to a facility. Randell Patient understands that Hospice facility is always an option if she decides Home with hospice is took taxing for her. She expressed she would feel bad if she placed him in a hospice facility but is interested in possibly utilizing their respite services if needed. Symptom management on the palliative side of care was discussed  and Randell Patient is open to any recommendations by the team to ensure [place] comfort during his transition home to hospice. It was discussed that Hospice for [facility] would be best based on location and will be contacted ASAP in the hopes for them to visit the house today. The plan moving forward is to hopefully transition Tony Chapman to home with hospice from the Emergency department. In the event arrangements with hospice cannot be made in a timely manner, Tony Chapman will be transferred upto the floor until discharge.  Advanced directives, concepts specific to code status, artifical feeding and hydration, and rehospitalization have previously been completed prior to admission by Tony Chapman.  Discussed the importance of continued conversation with family and the medical providers regarding overall plan of care and treatment options, ensuring decisions are within the context of the patient's values and GOCs.    Questions and concerns were addressed. The family was encouraged to call with questions or concerns.  PMT will continue to support holistically.    Primary Decision Maker:  HCPOA Son - Tony Chapman     SUMMARY OF RECOMMENDATIONS    Code Status/Advance Care Planning: DNR  Continuation of medical management for NSTEMI with emphasis on keeping patient comfortable until discharge  Plan is to discharge home with hospice   Symptom Management:  Manage pain and discomfort with Morphine as needed - Please send RX with patient on DC for this as it is ordered here. Manage anxiety and agitation with Ativan as needed - Please send RX with patient on DC for this as it is ordered here. Ensure comfort of patient and reassess medication management as needed  Reach out to palliative for any questions or concerns regarding comfort of the patient   Additional Recommendations (Limitations, Scope, Preferences): Minimize Medications and with emphasis on comfort  Palliative Prophylaxis:  Delirium Protocol,  Frequent Pain Assessment, and Oral Care  Psycho-social/Spiritual:  Desire for further Chaplaincy support: Will request support if patient is transferred to the floor prior to discharge to home with hospice   Prognosis:  < 6 months (Would not be surprised if weeks to months due to high likelihood of another cardiac event)   Discharge Planning: Home with Hospice      Primary Diagnoses: Present on Admission:  NSTEMI (non-ST elevated myocardial infarction) ([place])  Non-ST elevation MI (NSTEMI) ([place])   I have reviewed the medical record, interviewed the patient and family, and examined the patient. The following aspects are pertinent.  Past Medical History:  Diagnosis Date   CAD (coronary artery disease)    Dementia ([place])    DVT (deep venous thrombosis) (HCC)    HLD (hyperlipidemia)    HTN (hypertension)    Hypothyroid    Lumbar spondylosis    Prostate cancer [facility])    Social History   Socioeconomic History   Marital status: Divorced    Spouse name:  Not on file   Number of children: Not on file   Years of education: Not on file   Highest education level: Not on file  Occupational History   Occupation: pastor  Tobacco Use   Smoking status: Never   Smokeless tobacco: Never  Vaping Use   Vaping Use: Never used  Substance and Sexual Activity   Alcohol use: Not Currently   Drug use: Never   Sexual activity: Not Currently  Other Topics Concern   Not on file  Social History Narrative   Right handed    Lives at home with family lives with wife    Social Determinants of Health   Financial Resource Strain: Low Risk    Difficulty of Paying Living Expenses: Not very hard  Food Insecurity: No Food Insecurity   Worried About Charity fundraiser in the Last Year: Never true   [place] in the Last Year: Never true  Transportation Needs: No Transportation Needs   Lack of Transportation (Medical): No   Lack of Transportation (Non-Medical): No  Physical Activity:  Insufficiently Active   Days of Exercise per Week: 7 days   Minutes of Exercise per Session: 10 min  Stress: No Stress Concern Present   Feeling of Stress : Only a little  Social Connections: Moderately Integrated   Frequency of Communication with Friends and Family: More than three times a week   Frequency of Social Gatherings with Friends and Family: More than three times a week   Attends Religious Services: More than 4 times per year   Active Member of [employer] or Organizations: No   Attends Music therapist: Never   Marital Status: Married   Family History  Problem Relation Age of Onset   CVA Father     No Known Allergies   Vital Signs: BP (!) 145/76   Pulse (!) 54   Temp 97.9 F (36.6 C) (Axillary)   Resp 13   SpO2 96%  Pain Scale: PAINAD     SpO2: SpO2: 96 % O2 Device:SpO2: 96 % O2 Flow Rate: .     Palliative Assessment/Data: 40% PPS      Time In: 12:00 Time Out: 1:00 Time Total: 60 mins   Visit consisted of counseling and education dealing with the complex and emotionally intense issues surrounding the need for palliative care and symptom management in the setting of serious and potentially life-threatening illness. Greater than 50%  of this time was spent counseling and coordinating care related to the above assessment and plan.  Signed by: Florentina Jenny, PA-C Palliative Medicine Demetrios Isaacs, PA-S2  Please contact Palliative Medicine Team phone at [phone] for questions and concerns.  For individual provider: See Shea Evans

## 2020-08-10 NOTE — Consult Note (Signed)
Cardiology Consultation:   Patient ID: BAYLEE MCCORKEL MRN: [id number]; DOB: 1940-05-25  Admit date: 08/09/2020 Date of Consult: 08/10/2020  PCP:  Glenda Chroman, MD   [facility] Providers Cardiologist:  Lauree Chandler, MD        Patient Profile:   PIETER FOOKS is a 80 y.o. male with a hx of moderate to severe dementia, HTN,  CAD, HLD, hypothyroidism, Mobitz I, and hx of DVT who is being seen 08/10/2020 for the evaluation of NSTEMI at the request of Dr. Ree Kida.  History of Present Illness:   Mr. Okey with above hx and recent CHF, CAD with hx of PCI X 2 in 2006 to RCA, 11/2019 admitted with inf STEMI and had PTCA to [facility] with poor result (80% residual stenosis and 60% residual stenosis) and vessel occluded distally.  LAD distally 99% stenosis and mLAD 40% , Ostial LCX 100% stenosis.  RCA noted to be severely calcified with diffuse disease. Dr. Martinique reviewed films and felt pt was poor candidate for further PCI with advanced demential.   Medical therapy planned. With hx of mobitz I he is unable to take BB.    Last echo 12/12/19 with EF 50-55%, + RWMA, There is hypokinesis of the left ventricular, basal inferior segment, inferoseptal wall and inferior wall, RV normal, LA mildly dilated.    He was seen in Urgent Care 06/18/20 with lethargy this was after lasix changed to torsemide.  His amlodipine was reduced to 5 mg    Now presents 08/09/20 to ER for SOB, diaphoresis, chest pain and weakness.  He had 4 episodes of chest pain yesterday and treated with NTG.  He has had 4 falls last week as well. He was admitted by Hospitalist.    EKG:  The EKG was personally reviewed and demonstrates:  SR at 72 with LAD and rare PVC no acute ST changes compared to prior EKGs. Telemetry:  Telemetry was personally reviewed and demonstrates:  SB to 48 at times mostly in the 50s and at times Bigeminy PVCs. No Mobitz 1 seen  Hs troponin 353, 463, 452  Na 140, K+ 3.6 BUN 11 Cr. 1.23 Mg+ 2.0 LFTs WNL   WBC 9.0 Hgb 12.3 Hct 37.8 plts 316   2V CXR IMPRESSION: Patchy ground-glass opacity in both lungs. Multifocal pneumonia including atypical etiology would be a consideration.  Placed on IV heparin for 48 to 72 hours Echo pending. Brilinta held while on Heparin  Currently no chest pain and no SOB,  BP 159/97 P 52 afebrile and R 13-15   Past Medical History:  Diagnosis Date   CAD (coronary artery disease)    Dementia ([place])    DVT (deep venous thrombosis) (HCC)    HLD (hyperlipidemia)    HTN (hypertension)    Hypothyroid    Lumbar spondylosis    Prostate cancer [facility])     Past Surgical History:  Procedure Laterality Date   BACK SURGERY     CORONARY/GRAFT ACUTE MI REVASCULARIZATION N/A 12/11/2019   Procedure: Coronary/Graft Acute MI Revascularization;  Surgeon: Burnell Blanks, MD;  Location: [place] [facility];  Service: Cardiovascular;  Laterality: N/A;   LEFT HEART CATH AND CORONARY ANGIOGRAPHY N/A 12/11/2019   Procedure: LEFT HEART CATH AND CORONARY ANGIOGRAPHY;  Surgeon: Burnell Blanks, MD;  Location: [place] [facility];  Service: Cardiovascular;  Laterality: N/A;     Home Medications:  Prior to Admission medications   Medication Sig Start Date End Date Taking? Authorizing Provider  amLODipine (  NORVASC) 5 MG tablet Take 1 tablet (5 mg total) by mouth daily. 06/17/20  Yes Nahser, Wonda Cheng, MD  aspirin EC 81 MG tablet Take 81 mg by mouth daily. Swallow whole.   Yes [provider]  atorvastatin (LIPITOR) 80 MG tablet Take 80 mg by mouth daily.   Yes [provider]  benazepril (LOTENSIN) 20 MG tablet Take 20 mg by mouth daily.   Yes [provider]  diphenhydrAMINE (BENADRYL) 25 MG tablet Take 25 mg by mouth every other day.   Yes [provider]  donepezil (ARICEPT) 10 MG tablet Take 10 mg by mouth at bedtime.   Yes [provider]  isosorbide mononitrate (IMDUR) 30 MG 24 hr tablet Take 30 mg by mouth daily.   Yes  [provider]  levothyroxine (SYNTHROID) 100 MCG tablet Take 100 mcg by mouth daily before breakfast.   Yes [provider]  memantine (NAMENDA) 5 MG tablet Take 1 tablet (5 mg total) by mouth 2 (two) times daily. 04/07/20  Yes Cameron Sprang, MD  nitroGLYCERIN (NITROSTAT) 0.4 MG SL tablet PLACE 1 TABLET (0.4 MG TOTAL) UNDER THE TONGUE EVERY FIVE MINUTES AS NEEDED. 12/15/19 12/14/20 Yes Cheryln Manly, NP  potassium chloride (KLOR-CON) 10 MEQ tablet Take 1 tablet (10 mEq total) by mouth daily. 06/17/20  Yes Nahser, Wonda Cheng, MD  risperiDONE (RISPERDAL) 0.5 MG tablet Take 1 and 1/2 tablets every other night Patient taking differently: Take 0.75 mg by mouth every other day. 04/07/20  Yes Cameron Sprang, MD  ticagrelor (BRILINTA) 90 MG TABS tablet Take 1 tablet (90 mg total) by mouth 2 (two) times daily. 12/15/19  Yes Reino Bellis B, NP  torsemide (DEMADEX) 20 MG tablet Take 1 tablet (20 mg total) by mouth daily. 06/17/20  Yes Nahser, Wonda Cheng, MD    Inpatient Medications: Scheduled Meds:  amLODipine  5 mg Oral Daily   aspirin EC  81 mg Oral Daily   atorvastatin  80 mg Oral Daily   benazepril  20 mg Oral Daily   diphenhydrAMINE  25 mg Oral QODAY   donepezil  10 mg Oral QHS   isosorbide mononitrate  30 mg Oral Daily   levothyroxine  100 mcg Oral Q0600   memantine  5 mg Oral BID   potassium chloride  10 mEq Oral Daily   risperiDONE  0.75 mg Oral QODAY   torsemide  20 mg Oral Daily   Continuous Infusions:  heparin 900 Units/hr (08/10/20 0835)   PRN Meds: acetaminophen, hydrALAZINE, nitroGLYCERIN, ondansetron (ZOFRAN) IV  Allergies:   No Known Allergies  Social History:   Social History   Socioeconomic History   Marital status: Divorced    Spouse name: Not on file   Number of children: Not on file   Years of education: Not on file   Highest education level: Not on file  Occupational History   Occupation: pastor  Tobacco Use   Smoking status: Never    Smokeless tobacco: Never  Vaping Use   Vaping Use: Never used  Substance and Sexual Activity   Alcohol use: Not Currently   Drug use: Never   Sexual activity: Not Currently  Other Topics Concern   Not on file  Social History Narrative   Right handed    Lives at home with family lives with wife    Social Determinants of Health   Financial Resource Strain: Low Risk    Difficulty of Paying Living Expenses: Not very hard  Food  Insecurity: No Food Insecurity   Worried About Charity fundraiser in the Last Year: Never true   Ran Out of Food in the Last Year: Never true  Transportation Needs: No Transportation Needs   Lack of Transportation (Medical): No   Lack of Transportation (Non-Medical): No  Physical Activity: Insufficiently Active   Days of Exercise per Week: 7 days   Minutes of Exercise per Session: 10 min  Stress: No Stress Concern Present   Feeling of Stress : Only a little  Social Connections: Moderately Integrated   Frequency of Communication with Friends and Family: More than three times a week   Frequency of Social Gatherings with Friends and Family: More than three times a week   Attends Religious Services: More than 4 times per year   Active Member of [employer] or Organizations: No   Attends Music therapist: Never   Marital Status: Married  Human resources officer Violence: Not At Risk   Fear of Current or Ex-Partner: No   Emotionally Abused: No   Physically Abused: No   Sexually Abused: No    Family History:    Family History  Problem Relation Age of Onset   CVA Father      ROS:  Please see the history of present illness. Pt unable to give answers please see HPI and most information obtained in chart. General:no colds or fevers, no weight changes Skin:no rashes or ulcers HEENT:no blurred vision, no congestion CV:see HPI PUL:see HPI GI:no diarrhea constipation or melena, no indigestion GU:no hematuria, no dysuria MS:no joint pain, no  claudication Neuro:no syncope, no lightheadedness Endo:no diabetes, + thyroid disease  All other ROS reviewed and negative.     Physical Exam/Data:   Vitals:   08/10/20 0615 08/10/20 0630 08/10/20 0734 08/10/20 1030  BP: 136/70 139/71  (!) 145/76  Pulse: (!) 53 (!) 50  (!) 54  Resp: 20 14  13   Temp:   97.9 F (36.6 C)   TempSrc:   Axillary   SpO2: 95% 99%  96%   No intake or output data in the 24 hours ending 08/10/20 1130 Last 3 Weights 06/17/2020 04/20/2020 04/07/2020  Weight (lbs) 182 lb 186 lb 187 lb 9.6 oz  Weight (kg) 82.555 kg 84.369 kg 85.095 kg     There is no height or weight on file to calculate BMI.  General:  Well nourished, elderly male, seems older than stated age, in no acute distress HEENT: normal Lymph: no adenopathy Neck: no JVD Endocrine:  No thryomegaly Vascular: No carotid bruits; pedal pulses 2+ bilaterally  Cardiac:  normal S1, S2; RRR; no murmur gallup rub or click Lungs:  clear to auscultation bilaterally, no wheezing, rhonchi or rales  Abd: soft, nontender, no hepatomegaly  Ext: no edema Musculoskeletal:  No deformities, BUE and BLE strength normal and equal Skin: warm and dry  Neuro:  answered 2 questions then did not speak, followed directions on breathing.  MAE.  Psych:  flataffect   Relevant CV Studies: Cardiac cath 12/11/19 Diagnostic Dominance: Right    Intervention        Prox RCA to Mid RCA lesion is 60% stenosed. Prox RCA lesion is 100% stenosed. Mid RCA to Dist RCA lesion is 100% stenosed. Ost Cx to Prox Cx lesion is 100% stenosed. 1st Mrg lesion is 100% stenosed. Ost LAD to Mid LAD lesion is 40% stenosed. 2nd Diag lesion is 99% stenosed. Mid LAD lesion is 40% stenosed. Dist LAD lesion is 99% stenosed. Balloon angioplasty  was performed using a BALLOON SAPPHIRE 2.5X15. Post intervention, there is a 80% residual stenosis. Post intervention, there is a 60% residual stenosis.   1. The LAD is patent to the apex. Moderate  calcific disease in the proximal and mid LAD. Severe apical LAD stenosis. The small caliber second diagonal branch has a severe mid stenosis. 2. Chronic occlusion of the ostial Circumflex. 3. Proximal occlusion of the RCA. The entire vessel is heavily calcified. There is a stent in the mid vessel. The distal lesion is felt to be a chronic total occlusion. The distal branches fill from left to right collaterals.   Recommendations: Will admit to the ICU. Will continue ASA, Brilinta and statin. Resume other home medications tomorrow after pharmacy medication review. I will continue Aggrastat for 18 hours. Will review films with the IC team but I do not think further PCI of the RCA is feasible given the chronic occlusion distally and heavy calcification of the entire vessel. He is pain free with a stable blood pressure. Anticipate conservative management from this point given his advanced dementia.   Echo 12/12/19  IMPRESSIONS     1. Left ventricular ejection fraction, by estimation, is 50 to 55%. The  left ventricle has low normal function. The left ventricle demonstrates  regional wall motion abnormalities (see scoring diagram/findings for  description). Left ventricular diastolic   parameters are indeterminate. There is hypokinesis of the left  ventricular, basal inferior segment, inferoseptal wall and inferior wall.   2. Right ventricular systolic function is normal. The right ventricular  size is normal. There is normal pulmonary artery systolic pressure.   3. Left atrial size was mildly dilated.   4. The mitral valve is normal in structure. Trivial mitral valve  regurgitation.   5. The aortic valve is tricuspid. There is mild calcification of the  aortic valve. There is mild thickening of the aortic valve. Aortic valve  regurgitation is not visualized. Mild aortic valve sclerosis is present,  with no evidence of aortic valve  stenosis.   6. Aortic dilatation noted. There is mild  dilatation of the ascending  aorta, measuring 39 mm.   7. The inferior vena cava is normal in size with greater than 50%  respiratory variability, suggesting right atrial pressure of 3 mmHg.   Comparison(s): No prior Echocardiogram.   Conclusion(s)/Recommendation(s): Hypokinesis of basal inferior and  inferoseptal wall with overall low normal EF. No significant valve  disease.   FINDINGS   Left Ventricle: Left ventricular ejection fraction, by estimation, is 50  to 55%. The left ventricle has low normal function. The left ventricle  demonstrates regional wall motion abnormalities. The left ventricular  internal cavity size was normal in  size. There is no left ventricular hypertrophy. Left ventricular diastolic  parameters are indeterminate.   Right Ventricle: The right ventricular size is normal. Right vetricular  wall thickness was not well visualized. Right ventricular systolic  function is normal. There is normal pulmonary artery systolic pressure.  The tricuspid regurgitant velocity is 2.77  m/s, and with an assumed right atrial pressure of 3 mmHg, the estimated  right ventricular systolic pressure is 72.5 mmHg.   Left Atrium: Left atrial size was mildly dilated.   Right Atrium: Right atrial size was normal in size.   Pericardium: Trivial pericardial effusion is present.   Mitral Valve: The mitral valve is normal in structure. Trivial mitral  valve regurgitation.   Tricuspid Valve: The tricuspid valve is normal in structure. Tricuspid  valve regurgitation is  mild.   Aortic Valve: The aortic valve is tricuspid. There is mild calcification  of the aortic valve. There is mild thickening of the aortic valve. There  is mild to moderate aortic valve annular calcification. Aortic valve  regurgitation is not visualized. Mild  aortic valve sclerosis is present, with no evidence of aortic valve  stenosis.   Pulmonic Valve: The pulmonic valve was not well visualized. Pulmonic  valve  regurgitation is not visualized.   Aorta: Aortic dilatation noted. There is mild dilatation of the ascending  aorta, measuring 39 mm.   Venous: The inferior vena cava is normal in size with greater than 50%  respiratory variability, suggesting right atrial pressure of 3 mmHg.   IAS/Shunts: The atrial septum is grossly normal.       Laboratory Data:  High Sensitivity Troponin:   Recent Labs  Lab 08/09/20 0047 08/09/20 1903 08/09/20 2117 08/10/20 0235  TROPONINIHS 433* 353* 463* 452*     Chemistry Recent Labs  Lab 08/09/20 1903  NA 140  K 3.6  CL 106  CO2 26  GLUCOSE 103*  BUN 11  CREATININE 1.23  CALCIUM 9.4  GFRNONAA >60  ANIONGAP 8    Recent Labs  Lab 08/09/20 1903  PROT 6.5  ALBUMIN 3.7  AST 24  ALT 17  ALKPHOS 87  BILITOT 0.8   Hematology Recent Labs  Lab 08/09/20 1903  WBC 9.0  RBC 4.00*  HGB 12.3*  HCT 37.8*  MCV 94.5  MCH 30.8  MCHC 32.5  RDW 13.2  PLT 316   BNPNo results for input(s): BNP, PROBNP in the last 168 hours.  DDimer No results for input(s): DDIMER in the last 168 hours.   Radiology/Studies:  DG Chest 2 View  Result Date: 08/09/2020 CLINICAL DATA:  Shortness of breath with multiple falls. EXAM: CHEST - 2 VIEW COMPARISON:  06/18/2020 FINDINGS: Patchy areas of ground-glass opacity are noted in both lungs with a basilar predominance. No pulmonary edema or pleural effusion. Cardiopericardial silhouette is at upper limits of normal for size. The visualized bony structures of the thorax show no acute abnormality. IMPRESSION: Patchy ground-glass opacity in both lungs. Multifocal pneumonia including atypical etiology would be a consideration. Electronically Signed   By: Misty Stanley M.D.   On: 08/09/2020 20:12   DG Pelvis 1-2 Views  Result Date: 08/09/2020 CLINICAL DATA:  History of multiple falls EXAM: PELVIS - 1 VIEW COMPARISON:  None. FINDINGS: Pelvic ring is intact. Degenerative changes of the hip joints are noted  bilaterally. 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 Postsurgical changes in the lower lumbar spine are noted. IMPRESSION: No acute abnormality noted. Electronically Signed   By: Inez Catalina M.D.   On: 08/09/2020 20:13   CT Head Wo Contrast  Result Date: 08/09/2020 CLINICAL DATA:  Head trauma.  On blood thinner. EXAM: CT HEAD WITHOUT CONTRAST TECHNIQUE: Contiguous axial images were obtained from the base of the skull through the vertex without intravenous contrast. COMPARISON:  MRI head 05/04/2020 FINDINGS: Brain: Generalized atrophy. Chronic white matter changes as noted on prior MRI. Negative for acute infarct, hemorrhage, mass Vascular: Negative for hyperdense vessel Skull: Negative Sinuses/Orbits: Mild mucosal edema paranasal sinuses. Air-fluid level right sphenoid sinus. Negative orbit Other: None IMPRESSION: Atrophy and chronic microvascular ischemic change in the white matter. No acute abnormality. Electronically Signed   By: Franchot Gallo M.D.   On: 08/09/2020 19:34     Assessment and Plan:   NSTEMI with [place]- pk troponin 463 - last cath as above  with PTCA last stent I believe 2006 but no documentation except in a progress note of 2 stents to a vessel.  Now on IV heparin for 48 to 72 hours then back to Brilinta. On last cath has severe 2 vessel disease of RVA and LCX and distal LAD 99% stenosis.  Medical therapy planned.  Pt on ASA.  No BB with hx of brady on BB then with Mobitz I.  Would increase imdur to 60 mg.  If BP allows could increase amlodipine. Will discuss with MD if we should recath or medical therapy alone HTN elevated on arrival up to 162/80. Home meds amlodipine 5, lotensin 20,imdur 30. And is on torsemide 20. Today 145/76  Brady HR 48 to 68 SB no Mobitz I seen currently but no BB due to brady HLD on lipitor 80 with LDL 74  Chronic diastolic CHF and changed to torsemide in April.  Has been doing better.  Would continue ? PNA vs CHF on CXR. Pt is DNR and palliative care seeing.  Pt lives with  family they hope to keep him in home as long as possible.     Risk Assessment/Risk Scores:     TIMI Risk Score for Unstable Angina or Non-ST Elevation MI:   The patient's TIMI risk score is 6, which indicates a 41% risk of all cause mortality, new or recurrent myocardial infarction or need for urgent revascularization in the next 14 days.          For questions or updates, please contact [place] Please consult [email or web address] for contact info under  Cecilie Kicks, NP  08/10/2020 11:30 AM

## 2020-08-10 NOTE — ED Notes (Signed)
RN fed pt

## 2020-08-10 NOTE — ED Notes (Signed)
Palliative care to bedside to see pt

## 2020-08-10 NOTE — ED Notes (Signed)
Pts family at bedside

## 2020-08-10 NOTE — Progress Notes (Signed)
ANTICOAGULATION CONSULT NOTE - Initial Consult  Pharmacy Consult for heparin Indication: chest pain/ACS  No Known Allergies  Patient Measurements:   Heparin Dosing Weight: 82 kg   Vital Signs: BP: 139/71 (06/22 0630) Pulse Rate: 50 (06/22 0630)  Labs: Recent Labs    08/09/20 1903 08/09/20 2117 08/10/20 0235 08/10/20 0649  HGB 12.3*  --   --   --   HCT 37.8*  --   --   --   PLT 316  --   --   --   LABPROT  --   --   --  15.2  INR  --   --   --  1.2  HEPARINUNFRC  --   --   --  0.71*  CREATININE 1.23  --   --   --   TROPONINIHS 353* 463* 452*  --     CrCl cannot be calculated (Unknown ideal weight.).  Assessment: 73 YOM with chest pain and elevated troponins to start IV heparin for ACS.   CBC pending from today, will follow up, Cr at baseline (2.3)  Heparin level resulted at 0.71 units/mL, slightly above goal  Goal of Therapy:  Heparin level 0.3-0.7 units/ml Monitor platelets by anticoagulation protocol: Yes   Plan:  -Decrease heparin infusion to 900units/hr -F/u 8 hr HL -Monitor daily HL, CBC and s/s of bleeding  Joetta Manners, PharmD, BCCCP Emergency Medicine Clinical Pharmacist  Please check AMION for all [place] phone numbers After 10:00 PM, call [place]

## 2020-08-10 NOTE — Care Management Obs Status (Signed)
[place] NOTIFICATION   Patient Details  Name: Tony Chapman MRN: [id number] Date of Birth: 1941-02-19   Medicare Observation Status Notification Given:  Yes    Fuller Mandril, RN 08/10/2020, 2:23 PM

## 2020-08-10 NOTE — ED Notes (Signed)
PER DO Mikhail to stop heparin gtt

## 2020-08-10 NOTE — ED Notes (Signed)
Help get patient cleaned up sheets changed placed a brief patient is resting with call bell in reach family at bedside and sitter

## 2020-08-10 NOTE — ED Notes (Signed)
RN attempted report x1.  

## 2020-08-10 NOTE — Discharge Summary (Addendum)
Physician Discharge Summary  Tony Chapman VHQ:[id number] DOB: 01-22-1941 DOA: 08/09/2020  PCP: Tony Chroman, MD  Admit date: 08/09/2020 Discharge date: 08/10/2020  Time spent: 45 minutes  Recommendations for Outpatient Follow-up:  Patient will be discharged to home with home hospice.  If needed, patient can follow-up with cardiology or primary care provider.  Patient should continue medications as prescribed.    Discharge Diagnoses:  NSTEMI/CAD Essential hypertension Chronic diastolic heart failure Hypothyroidism Chronic microcytic anemia Dementia Ambulatory dysfunction Goals of care  Discharge Condition: Stable  Diet recommendation: Heart healthy/comfort  There were no vitals filed for this visit.  History of present illness:  On 08/09/2020 by Dr. Irene Pap Tony Chapman is a 80 y.o. male with medical history significant for Coronary artery disease status post PCI to unknown artery at [facility], history of inferior STEMI admitted on 11/2019, underwent cardiac cath but was felt to be a poor candidate for further PCI with advanced dementia (per cardiology's office note, plan was for medical therapy, conservative approach.  Was placed on aspirin, Brilinta, statin, amlodipine, Imdur, no beta-blocker due to second-degree AV block, Mobitz 1), history of DVT, dementia with sundowning, hyperlipidemia, hypertension, chronic diastolic CHF, who presented to [facility] ED from home where he lives with his son and daughter-in-law due to chest pain.  History is limited due to the patient's dementia.  70 of the history is obtained from the patient's daughter in law, [place], at bedside.  She reports that this morning while at rest patient had complained of chest pain and was diaphoretic.  He had taken his cardiac medications.  EMS was activated.  He was brought into the ED for further evaluation.  While in the ED work-up revealed NSTEMI.  Was started on heparin drip, cardiology  was contacted by EDP, no plans for aggressive intervention, recommended IV heparin 48-72 hours.  TRH, hospitalist team was asked to admit.  Hospital Course:  NSTEMI/CAD -High-sensitivity troponin elevated at 433, 353, 463, 452 -Cardiology consulted and appreciate -Patient was placed on heparin -Family has opted for no invasive or aggressive management -Echocardiogram completed, however read is pending-if family wishes to pursue further management, PCP can follow-up on results -Continue aspirin, statin, benazepril, Imdur, Brilinta  Essential hypertension -Was uncontrolled on admission, 162/80 -BP has improved -Continue amlodipine, benazepril, Lasix  Chronic diastolic heart failure -Stable, appears to be euvolemic and compensated -Continue Lasix -Echocardiogram pending  Hypothyroidism -Continue Synthroid  Chronic microcytic anemia -Stable  Dementia -With behavioral disturbances and history of sundowning -Continue home regimen  Ambulatory dysfunction  Goals of care -Palliative care consulted and appreciated -Family has opted to take patient home with hospice -Hospital bed ordered  Procedures: Echocardiogram  Consultations: Cardiology Palliative care  Code status: DNR  Discharge Exam: Vitals:   08/10/20 0734 08/10/20 1030  BP:  (!) 145/76  Pulse:  (!) 54  Resp:  13  Temp: 97.9 F (36.6 C)   SpO2:  96%    General: Well developed, elderly, NAD HEENT: NCAT, mucous membranes moist. Cardiovascular: S1 S2 auscultated, RRR-bradycardic, no murmur Respiratory: Clear to auscultation bilaterall Abdomen: Soft, nontender, nondistended, + bowel sounds Extremities: warm dry without cyanosis clubbing or edema   Discharge Instructions Discharge Instructions     Discharge instructions   Complete by: As directed    If needed, patient can follow-up with cardiology or primary care provider.  Patient should continue medications as prescribed.   Increase activity slowly    Complete by: As directed  Allergies as of 08/10/2020   No Known Allergies      Medication List     TAKE these medications    amLODipine 5 MG tablet Commonly known as: NORVASC Take 1 tablet (5 mg total) by mouth daily.   aspirin EC 81 MG tablet Take 81 mg by mouth daily. Swallow whole.   atorvastatin 80 MG tablet Commonly known as: LIPITOR Take 80 mg by mouth daily.   benazepril 20 MG tablet Commonly known as: LOTENSIN Take 20 mg by mouth daily.   diphenhydrAMINE 25 MG tablet Commonly known as: BENADRYL Take 25 mg by mouth every other day.   donepezil 10 MG tablet Commonly known as: ARICEPT Take 10 mg by mouth at bedtime.   isosorbide mononitrate 60 MG 24 hr tablet Commonly known as: IMDUR Take 1 tablet (60 mg total) by mouth daily. Start taking on: August 11, 2020 What changed:  medication strength how much to take   levothyroxine 100 MCG tablet Commonly known as: SYNTHROID Take 100 mcg by mouth daily before breakfast.   LORazepam 0.5 MG tablet Commonly known as: Ativan Take 1 tablet (0.5 mg total) by mouth 2 (two) times daily.   memantine 5 MG tablet Commonly known as: NAMENDA Take 1 tablet (5 mg total) by mouth 2 (two) times daily.   morphine 20 MG/ML concentrated solution Commonly known as: ROXANOL Take 0.25-0.5 mLs (5-10 mg total) by mouth every 2 (two) hours as needed for severe pain (pain, SOB).   nitroGLYCERIN 0.4 MG SL tablet Commonly known as: NITROSTAT PLACE 1 TABLET (0.4 MG TOTAL) UNDER THE TONGUE EVERY FIVE MINUTES AS NEEDED.   potassium chloride 10 MEQ tablet Commonly known as: KLOR-CON Take 1 tablet (10 mEq total) by mouth daily.   risperiDONE 0.5 MG tablet Commonly known as: RISPERDAL Take 1.5 tablets (0.75 mg total) by mouth every other day.   ticagrelor 90 MG Tabs tablet Commonly known as: BRILINTA Take 1 tablet (90 mg total) by mouth 2 (two) times daily.   torsemide 20 MG tablet Commonly known as: DEMADEX Take 1  tablet (20 mg total) by mouth daily.               Durable Medical Equipment  (From admission, onward)           Start     Ordered   08/10/20 1219  For home use only DME Hospital bed  Once       Question Answer Comment  Length of Need Lifetime   Bed type Semi-electric      08/10/20 1219           No Known Allergies  Follow-up Information     Tony Chroman, MD. Schedule an appointment as soon as possible for a visit.   Specialty: Internal Medicine Why: As needed Contact information: [place] [id number] [phone]         Burnell Blanks, MD .   Specialty: Cardiology Contact information: [place] [address] [place] [address] [phone]                  The results of significant diagnostics from this hospitalization (including imaging, microbiology, ancillary and laboratory) are listed below for reference.    Significant Diagnostic Studies: DG Chest 2 View  Result Date: 08/09/2020 CLINICAL DATA:  Shortness of breath with multiple falls. EXAM: CHEST - 2 VIEW COMPARISON:  06/18/2020 FINDINGS: Patchy areas of ground-glass opacity are noted in both lungs with a  basilar predominance. No pulmonary edema or pleural effusion. Cardiopericardial silhouette is at upper limits of normal for size. The visualized bony structures of the thorax show no acute abnormality. IMPRESSION: Patchy ground-glass opacity in both lungs. Multifocal pneumonia including atypical etiology would be a consideration. Electronically Signed   By: Misty Stanley M.D.   On: 08/09/2020 20:12   DG Pelvis 1-2 Views  Result Date: 08/09/2020 CLINICAL DATA:  History of multiple falls EXAM: PELVIS - 1 VIEW COMPARISON:  None. FINDINGS: Pelvic ring is intact. Degenerative changes of the hip joints are noted bilaterally. 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 Postsurgical changes in the lower lumbar spine are noted. IMPRESSION: No acute abnormality noted. Electronically  Signed   By: Inez Catalina M.D.   On: 08/09/2020 20:13   CT Head Wo Contrast  Result Date: 08/09/2020 CLINICAL DATA:  Head trauma.  On blood thinner. EXAM: CT HEAD WITHOUT CONTRAST TECHNIQUE: Contiguous axial images were obtained from the base of the skull through the vertex without intravenous contrast. COMPARISON:  MRI head 05/04/2020 FINDINGS: Brain: Generalized atrophy. Chronic white matter changes as noted on prior MRI. Negative for acute infarct, hemorrhage, mass Vascular: Negative for hyperdense vessel Skull: Negative Sinuses/Orbits: Mild mucosal edema paranasal sinuses. Air-fluid level right sphenoid sinus. Negative orbit Other: None IMPRESSION: Atrophy and chronic microvascular ischemic change in the white matter. No acute abnormality. Electronically Signed   By: Franchot Gallo M.D.   On: 08/09/2020 19:34    Microbiology: Recent Results (from the past 240 hour(s))  Resp Panel by RT-PCR (Flu A&B, Covid) Nasopharyngeal Swab     Status: None   Collection Time: 08/09/20 10:20 PM   Specimen: Nasopharyngeal Swab; Nasopharyngeal(NP) swabs in vial transport medium  Result Value Ref Range Status   SARS Coronavirus 2 by RT PCR NEGATIVE NEGATIVE Final    Comment: (NOTE) SARS-CoV-2 target nucleic acids are NOT DETECTED.  The SARS-CoV-2 RNA is generally detectable in upper respiratory specimens during the acute phase of infection. The lowest concentration of SARS-CoV-2 viral copies this assay can detect is 138 copies/mL. A negative result does not preclude SARS-Cov-2 infection and should not be used as the sole basis for treatment or other patient management decisions. A negative result may occur with  improper specimen collection/handling, submission of specimen other than nasopharyngeal swab, presence of viral mutation(s) within the areas targeted by this assay, and inadequate number of viral copies(<138 copies/mL). A negative result must be combined with clinical observations, patient  history, and epidemiological information. The expected result is Negative.  Fact Sheet for Patients:  EntrepreneurPulse.com.au  Fact Sheet for Healthcare Providers:  IncredibleEmployment.be  This test is no t yet approved or cleared by the [place] FDA and  has been authorized for detection and/or diagnosis of SARS-CoV-2 by FDA under an Emergency Use Authorization (EUA). This EUA will remain  in effect (meaning this test can be used) for the duration of the COVID-19 declaration under Section 564(b)(1) of the Act, 21 U.S.C.section 360bbb-3(b)(1), unless the authorization is terminated  or revoked sooner.       Influenza A by PCR NEGATIVE NEGATIVE Final   Influenza B by PCR NEGATIVE NEGATIVE Final    Comment: (NOTE) The Xpert Xpress SARS-CoV-2/FLU/RSV plus assay is intended as an aid in the diagnosis of influenza from Nasopharyngeal swab specimens and should not be used as a sole basis for treatment. Nasal washings and aspirates are unacceptable for Xpert Xpress SARS-CoV-2/FLU/RSV testing.  Fact Sheet for Patients: EntrepreneurPulse.com.au  Fact Sheet for Healthcare Providers: IncredibleEmployment.be  This test is not yet approved or cleared by the [place] and has been authorized for detection and/or diagnosis of SARS-CoV-2 by FDA under an Emergency Use Authorization (EUA). This EUA will remain in effect (meaning this test can be used) for the duration of the COVID-19 declaration under Section 564(b)(1) of the Act, 21 U.S.C. section 360bbb-3(b)(1), unless the authorization is terminated or revoked.  Performed at [facility], [place] [address] [place], [place] [address]      Labs: Basic Metabolic Panel: Recent Labs  Lab 08/09/20 1903 08/10/20 0235  NA 140  --   K 3.6  --   CL 106  --   CO2 26  --   GLUCOSE 103*  --   BUN 11  --   CREATININE 1.23  --   CALCIUM 9.4  --    MG 2.0 1.9  PHOS  --  2.7   Liver Function Tests: Recent Labs  Lab 08/09/20 1903  AST 24  ALT 17  ALKPHOS 87  BILITOT 0.8  PROT 6.5  ALBUMIN 3.7   No results for input(s): LIPASE, AMYLASE in the last 168 hours. No results for input(s): AMMONIA in the last 168 hours. CBC: Recent Labs  Lab 08/09/20 1903 08/10/20 1345  WBC 9.0 8.0  HGB 12.3* 11.3*  HCT 37.8* 34.1*  MCV 94.5 94.2  PLT 316 274   Cardiac Enzymes: No results for input(s): CKTOTAL, CKMB, CKMBINDEX, TROPONINI in the last 168 hours. BNP: BNP (last 3 results) No results for input(s): BNP in the last 8760 hours.  ProBNP (last 3 results) No results for input(s): PROBNP in the last 8760 hours.  CBG: No results for input(s): GLUCAP in the last 168 hours.     Signed:  Cristal Ford  [facility] 08/10/2020, 3:12 PM

## 2020-08-10 NOTE — ED Notes (Signed)
Pt and pts IV was clean, dry, and intact. Pt had mittens on. Foley in place.

## 2020-08-10 NOTE — ED Notes (Signed)
Breakfast order placed ?

## 2020-08-10 NOTE — Discharge Planning (Signed)
Danahi Reddish J. Clydene Laming, RN, BSN, [place] [phone] RNCM consulted regarding discharge planning for Tony Chapman.  Pt family chose [facility] to render services. Donia Ast, LCSW of [facility] notified.  Jinny Blossom will contact family to arrange DME delivery and intake process.  Awaiting call from [place] to confirm [facility] services is in place.

## 2020-08-10 NOTE — ED Notes (Signed)
Pt is asleep

## 2020-08-10 NOTE — Progress Notes (Signed)
  Echocardiogram 2D Echocardiogram has been performed.  Randa Lynn Rigdon Macomber 08/10/2020, 12:16 PM

## 2020-08-10 NOTE — ED Notes (Addendum)
Sitter at bedside.

## 2020-08-10 NOTE — Care Management CC44 (Signed)
Condition Code 44 Documentation Completed  Patient Details  Name: Tony Chapman MRN: [id number] Date of Birth: 1940/07/31   Condition Code 44 given:  Yes Patient signature on Condition Code 44 notice:  Yes Documentation of 2 MD's agreement:  Yes Code 44 added to claim:  Yes    Fuller Mandril, RN 08/10/2020, 2:23 PM

## 2020-08-28 DIAGNOSIS — M1710 Unilateral primary osteoarthritis, unspecified knee: Secondary | ICD-10-CM | POA: Diagnosis not present

## 2020-09-08 ENCOUNTER — Encounter: Payer: Medicare Other | Admitting: Cardiothoracic Surgery

## 2020-09-19 DEATH — deceased

## 2020-11-02 ENCOUNTER — Ambulatory Visit: Payer: Medicare Other | Admitting: Neurology

## 2021-11-01 IMAGING — CR DG CHEST 2V
2 series · 2 of 2 positions shown · non-contrast
Comparison: 06/18/2020

CLINICAL DATA: Shortness of breath with multiple falls.

EXAM:
CHEST - 2 VIEW

[chest lat]
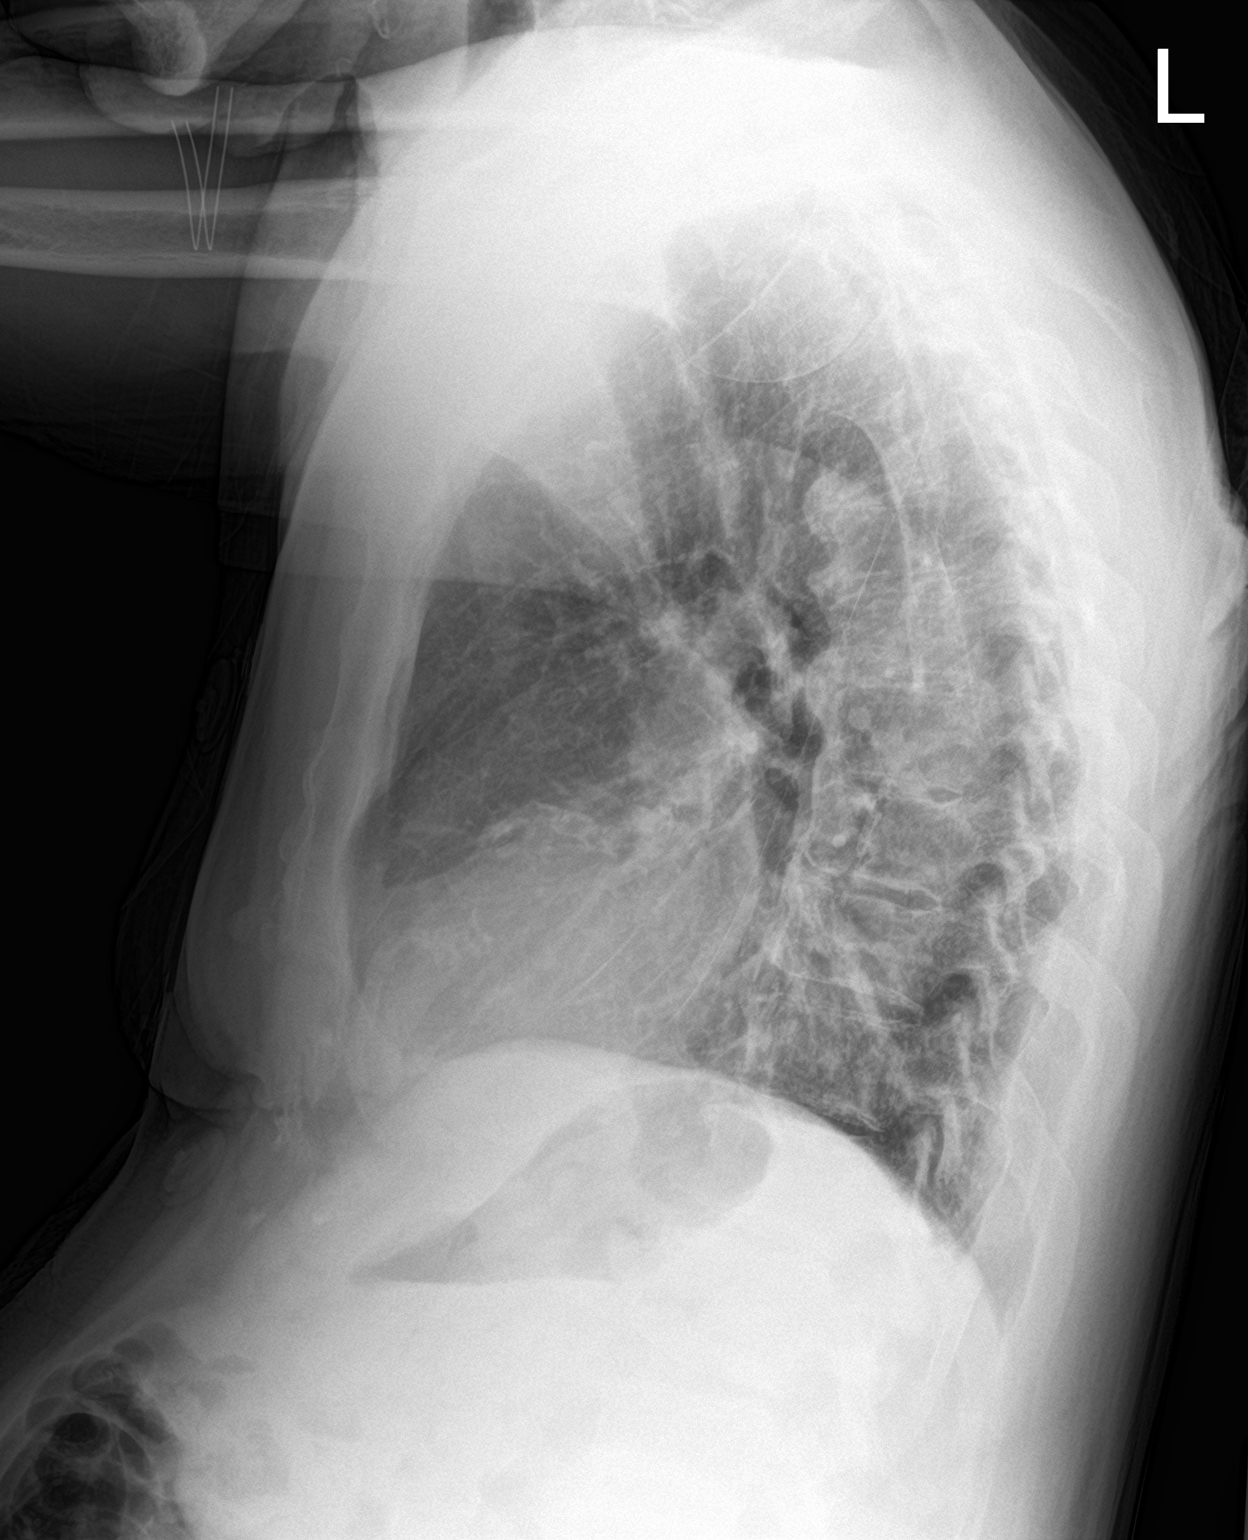

[chest ap]
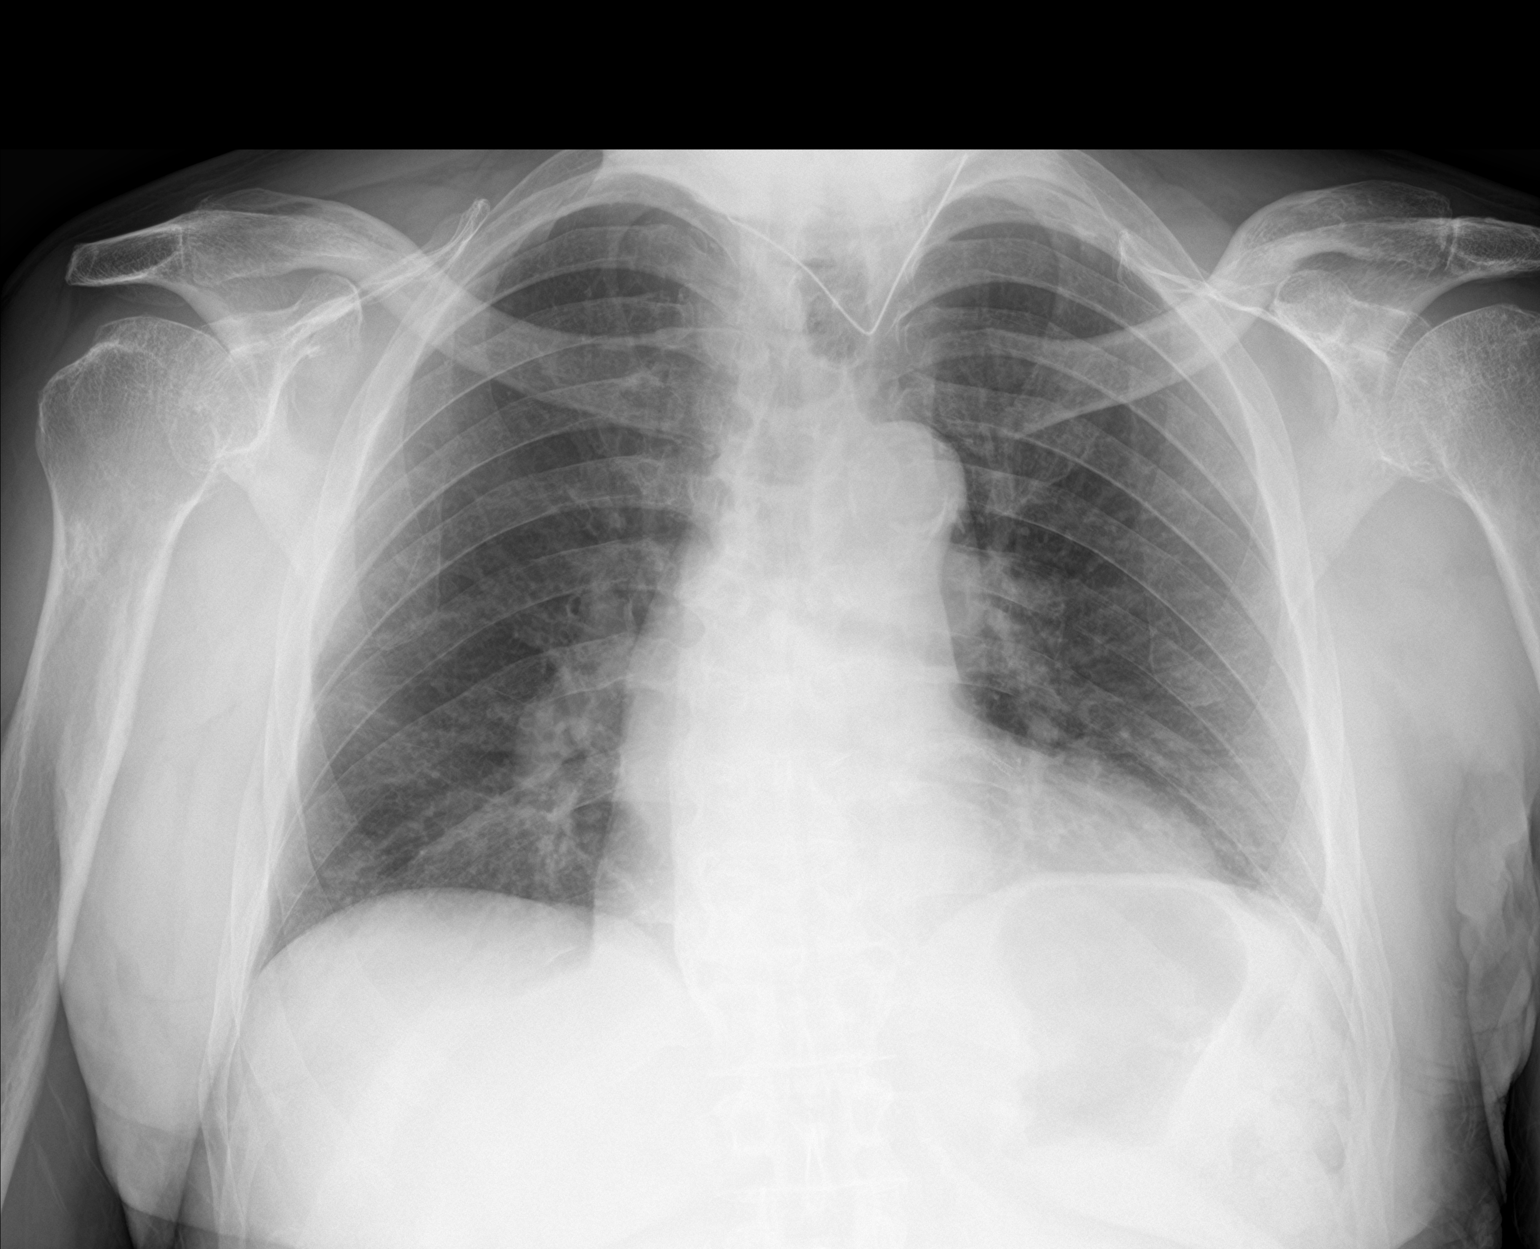

[2 of 2 positions shown; findings below may reference images not displayed]

FINDINGS: Patchy areas of ground-glass opacity are noted in both lungs with a
basilar predominance. No pulmonary edema or pleural effusion.
Cardiopericardial silhouette is at upper limits of normal for size.
The visualized bony structures of the thorax show no acute
abnormality.
IMPRESSION: Patchy ground-glass opacity in both lungs. Multifocal pneumonia
including atypical etiology would be a consideration.

## 2021-11-01 IMAGING — CR DG PELVIS 1-2V
1 series · 1 of 1 positions shown · non-contrast
Comparison: None.

CLINICAL DATA: History of multiple falls

EXAM:
PELVIS - 1 VIEW

[pelvis ap]
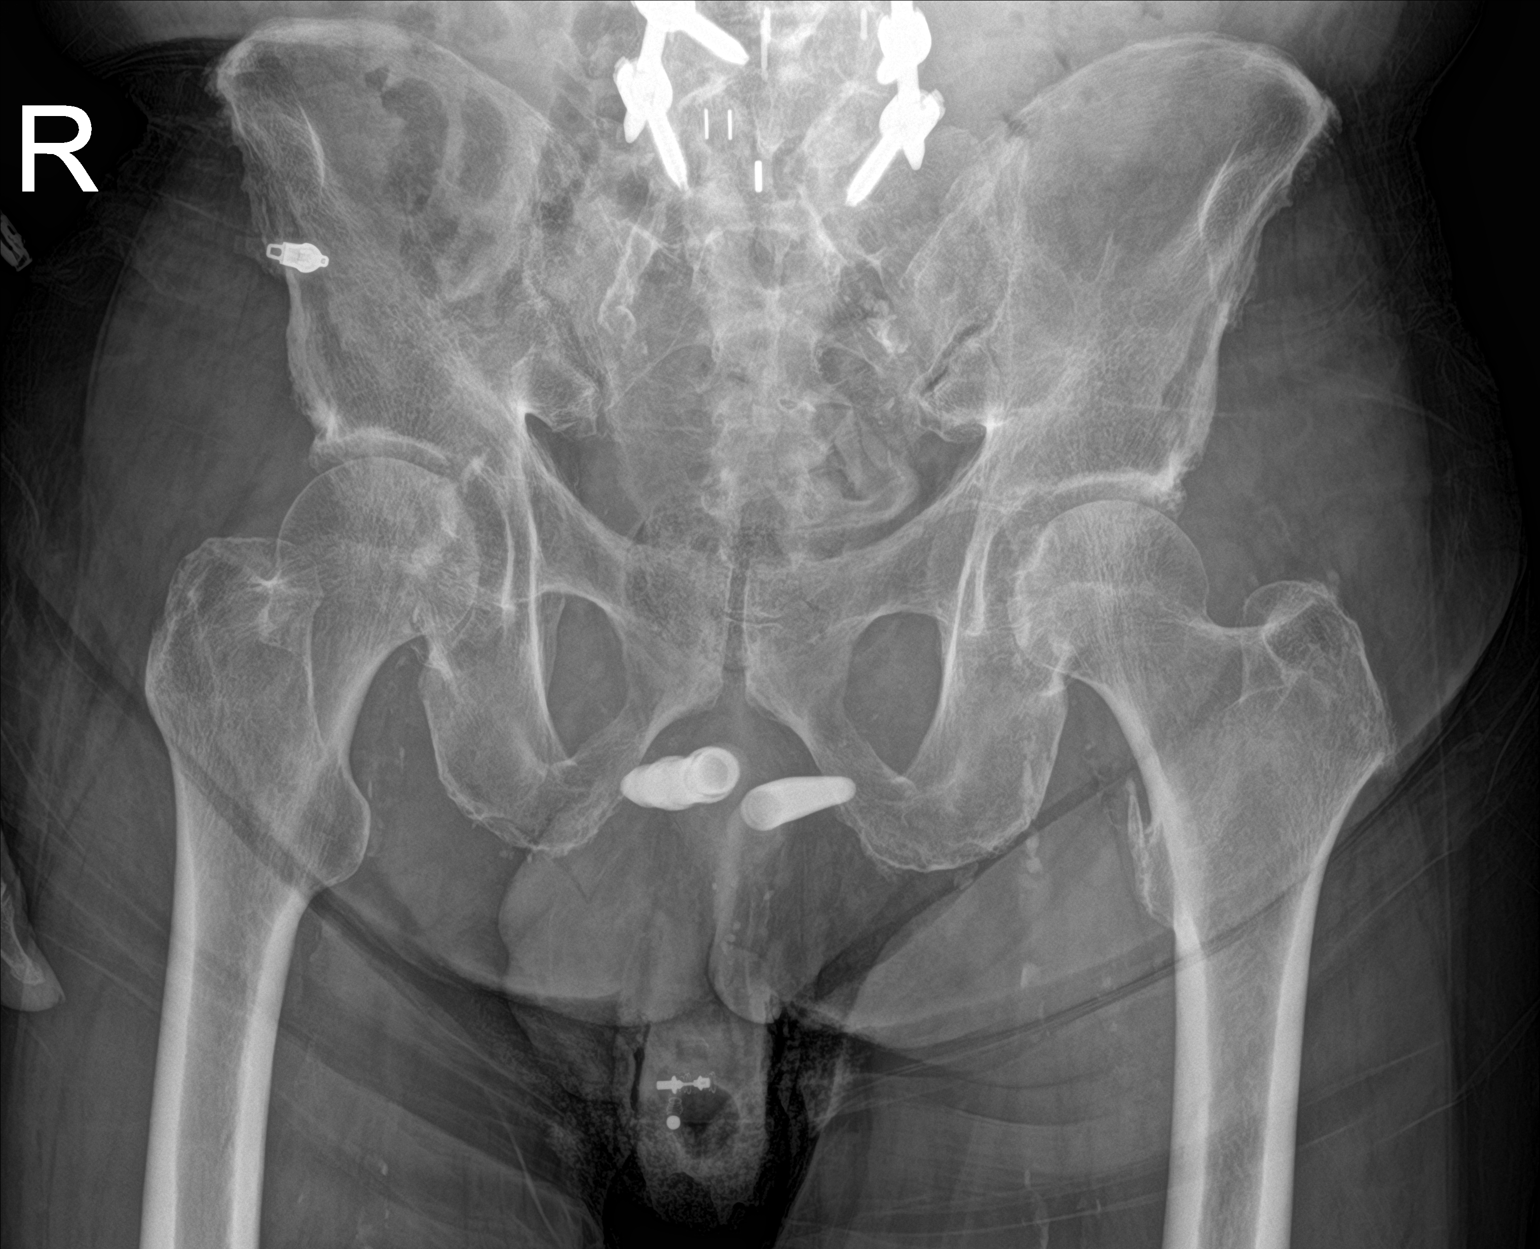

[1 of 1 positions shown; findings below may reference images not displayed]

FINDINGS: Pelvic ring is intact. Degenerative changes of the hip joints are
noted bilaterally. Penile prosthesis is seen. Postsurgical changes
in the lower lumbar spine are noted.
IMPRESSION: No acute abnormality noted.

## 2021-11-01 IMAGING — CT CT HEAD W/O CM
4 series · 17 of 47 positions shown, 19 images · non-contrast
Comparison: MRI head 05/04/2020

CLINICAL DATA: Head trauma.  On blood thinner.

EXAM:
CT HEAD WITHOUT CONTRAST
TECHNIQUE: Contiguous axial images were obtained from the base of the skull
through the vertex without intravenous contrast.

[Series 3: head wo · axial · 0.42mm/px · z∈[+1289,+1404]mm · 7 of 31 slices shown, 9 images]
[im 4/31  brain]
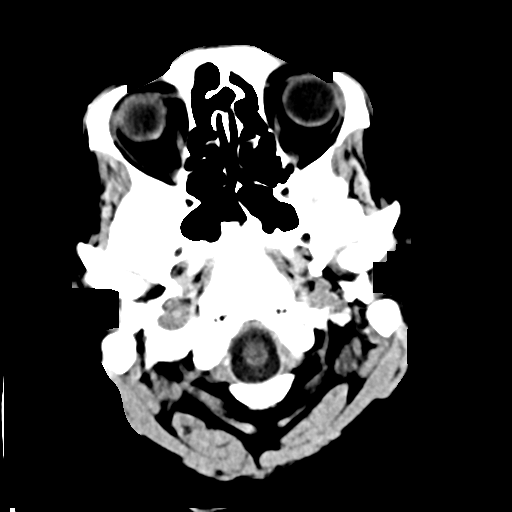
[im 4/31  bone]
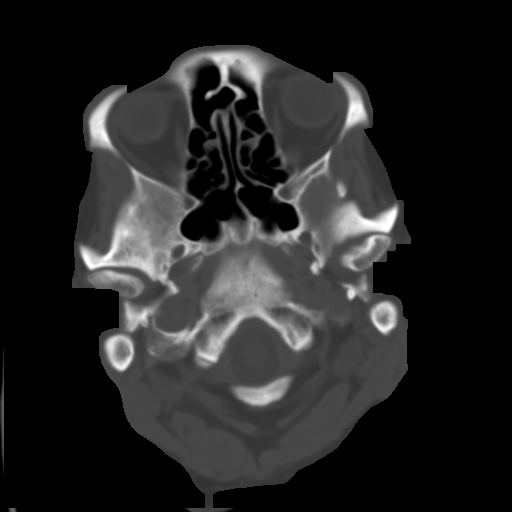
[im 8/31  brain]
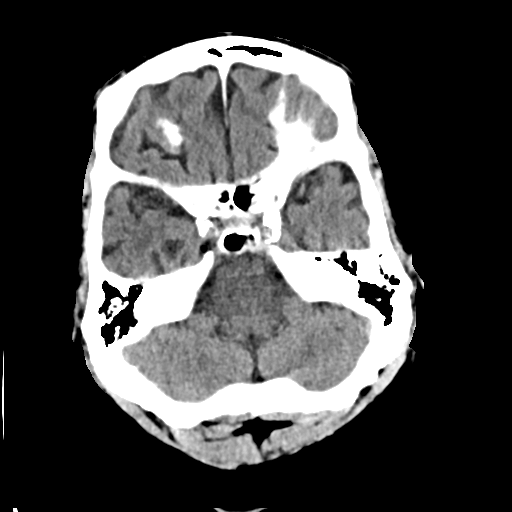
[im 12/31  brain]
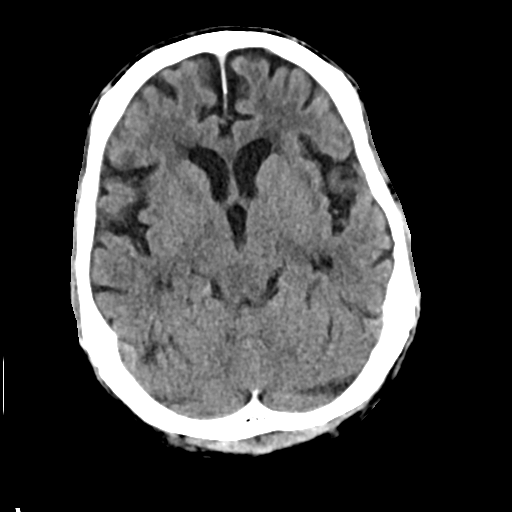
[im 16/31  brain]
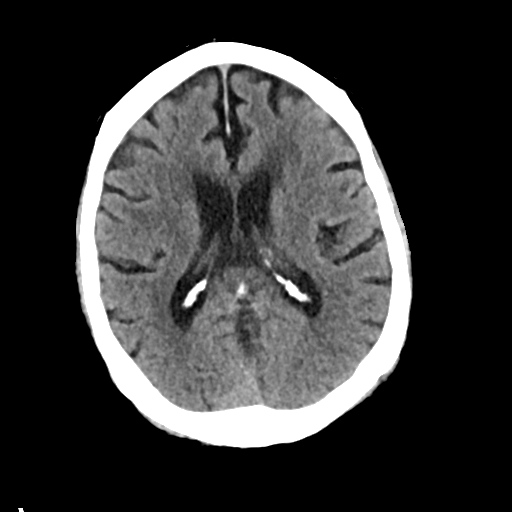
[im 19/31  brain]
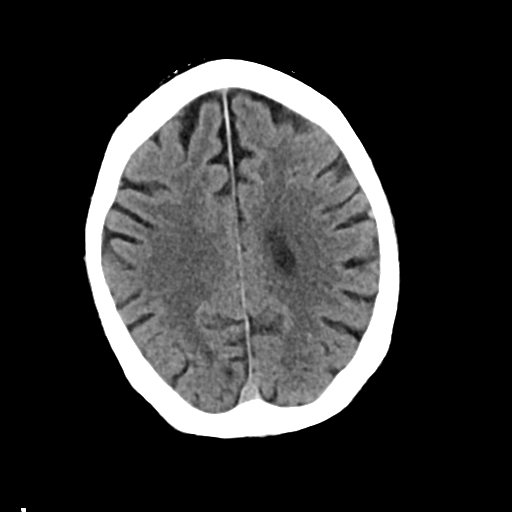
[im 19/31  bone]
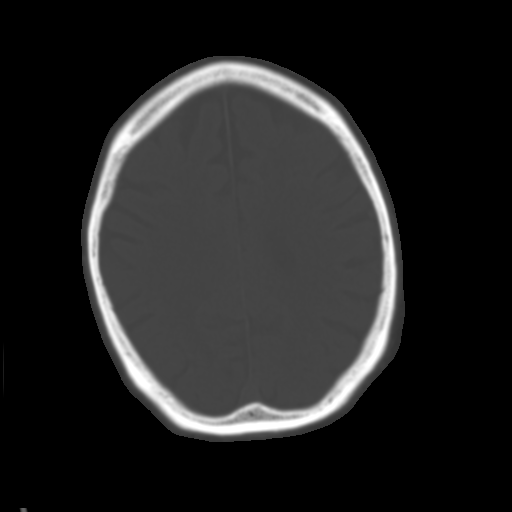
[im 23/31  brain]
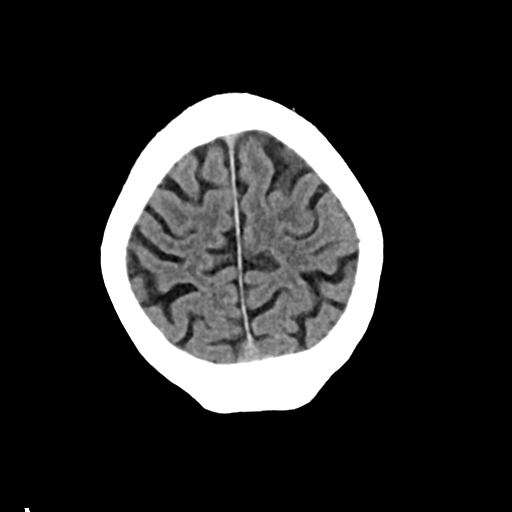
[im 27/31  brain]
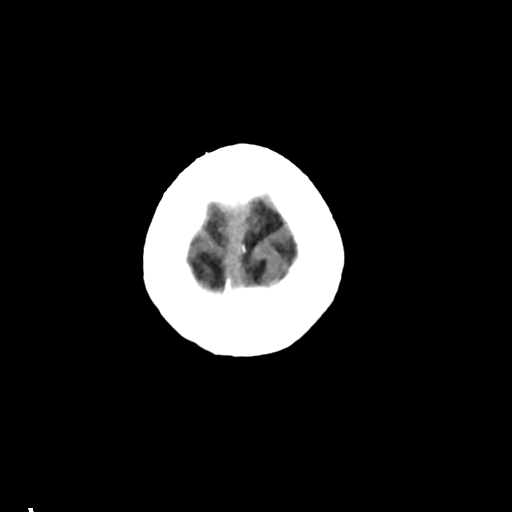

[Series 4: sag soft · sagittal · 0.33mm/px · 3 of 65 slices shown]
[im 22/65  brain]
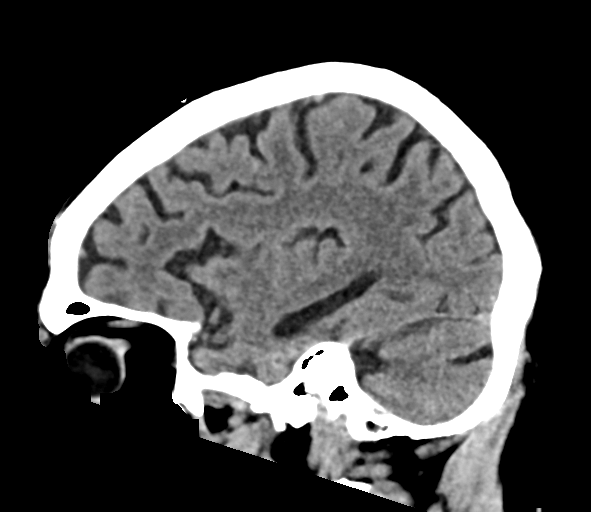
[im 33/65  brain]
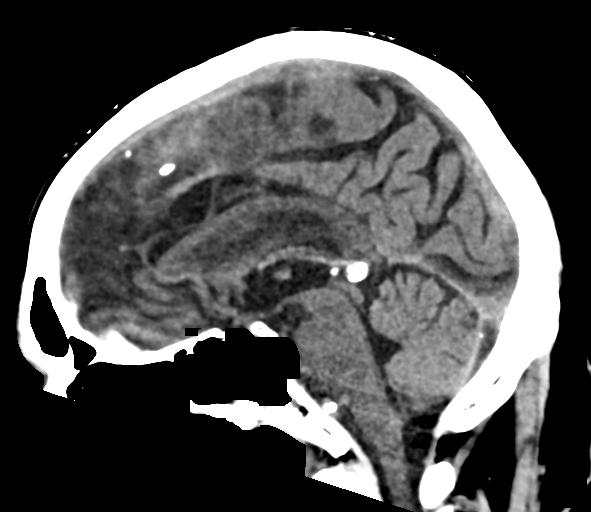
[im 43/65  brain]
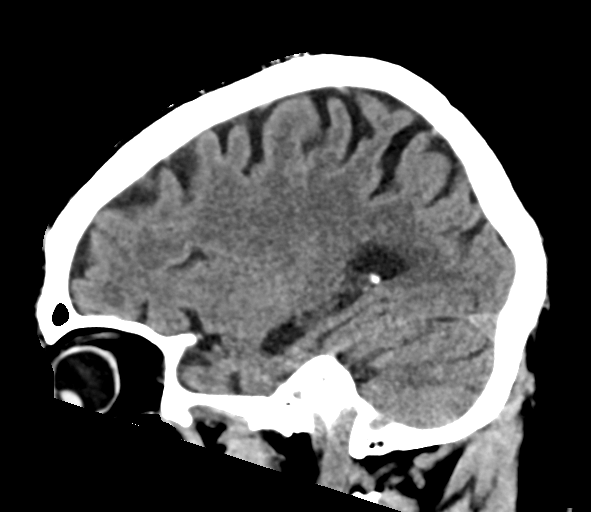

[Series 5: head bone · axial · 0.42mm/px · z∈[+1288,+1342]mm · 4 of 78 slices shown]
[im 8/78  bone]
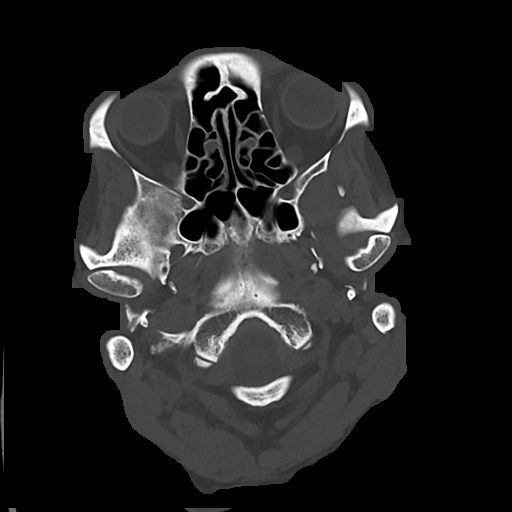
[im 16/78  bone]
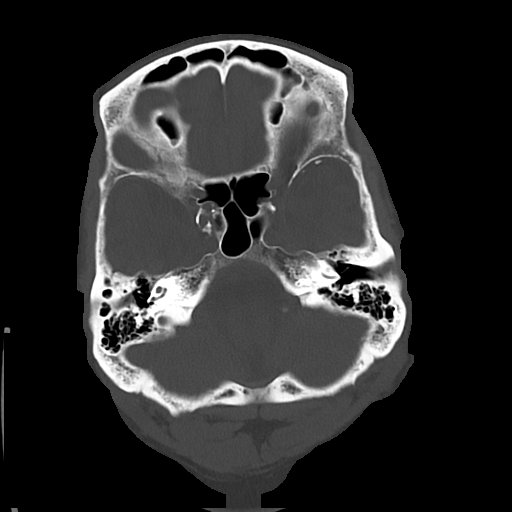
[im 24/78  bone]
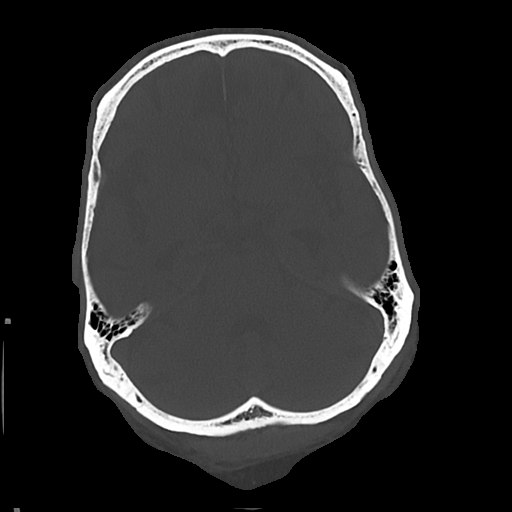
[im 35/78  bone]
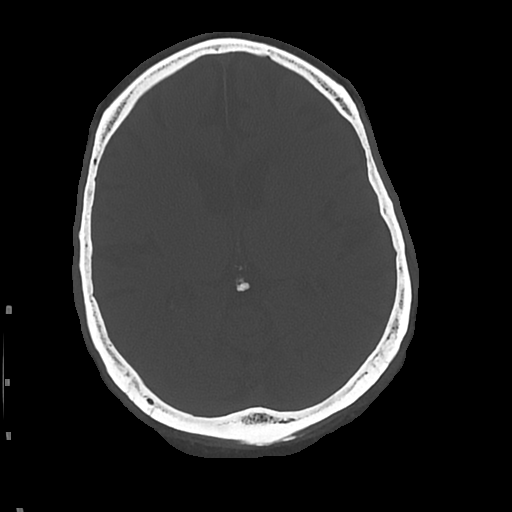

[Series 6: cor soft · coronal · 0.33mm/px · 3 of 65 slices shown]
[im 22/65  brain]
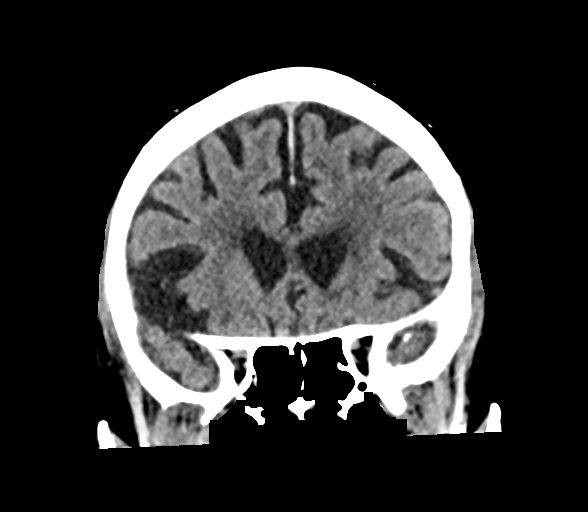
[im 29/65  brain]
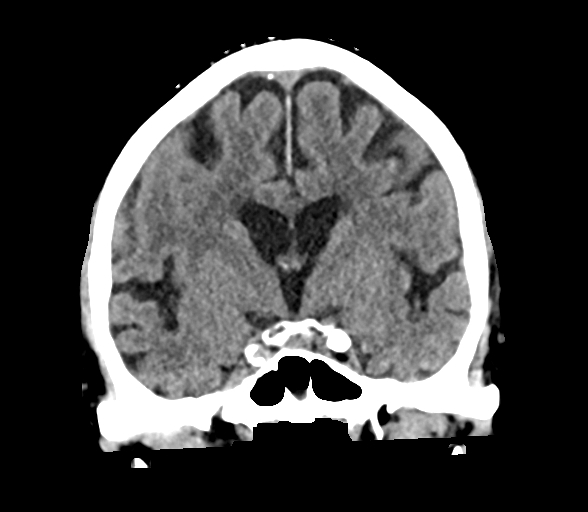
[im 36/65  brain]
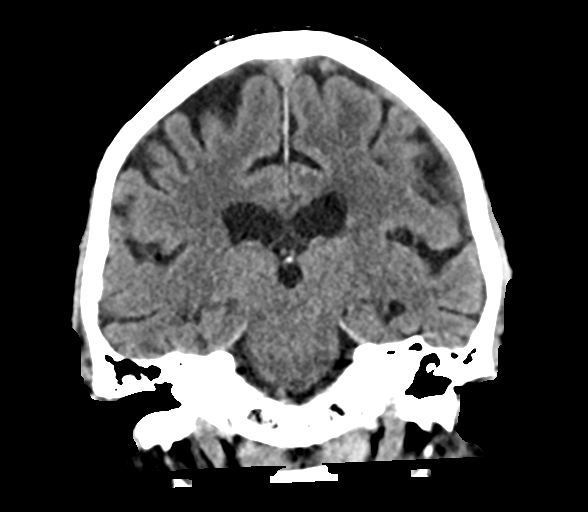

[17 of 47 positions shown; findings below may reference images not displayed]

FINDINGS: Brain: Generalized atrophy. Chronic white matter changes as noted on
prior MRI.

Negative for acute infarct, hemorrhage, mass

Vascular: Negative for hyperdense vessel

Skull: Negative

Sinuses/Orbits: Mild mucosal edema paranasal sinuses. Air-fluid
level right sphenoid sinus. Negative orbit

Other: None
IMPRESSION: Atrophy and chronic microvascular ischemic change in the white
matter. No acute abnormality.
